# Patient Record
Sex: Male | Born: 1970 | Race: White | Hispanic: No | Marital: Married | Smoking: Former smoker
Health system: Southern US, Community
[De-identification: ages and names within clinical notes are randomized; demographics above are authoritative.]

## PROBLEM LIST (undated history)

## (undated) DIAGNOSIS — K219 Gastro-esophageal reflux disease without esophagitis: Secondary | ICD-10-CM

## (undated) DIAGNOSIS — K449 Diaphragmatic hernia without obstruction or gangrene: Secondary | ICD-10-CM

## (undated) DIAGNOSIS — A0472 Enterocolitis due to Clostridium difficile, not specified as recurrent: Secondary | ICD-10-CM

## (undated) DIAGNOSIS — K635 Polyp of colon: Secondary | ICD-10-CM

## (undated) DIAGNOSIS — K5792 Diverticulitis of intestine, part unspecified, without perforation or abscess without bleeding: Secondary | ICD-10-CM

## (undated) DIAGNOSIS — J302 Other seasonal allergic rhinitis: Secondary | ICD-10-CM

## (undated) HISTORY — DX: Diaphragmatic hernia without obstruction or gangrene: K44.9

## (undated) HISTORY — DX: Polyp of colon: K63.5

## (undated) HISTORY — DX: Other seasonal allergic rhinitis: J30.2

## (undated) HISTORY — DX: Enterocolitis due to Clostridium difficile, not specified as recurrent: A04.72

## (undated) HISTORY — PX: NO PAST SURGERIES: SHX2092

## (undated) HISTORY — DX: Diverticulitis of intestine, part unspecified, without perforation or abscess without bleeding: K57.92

## (undated) HISTORY — DX: Gastro-esophageal reflux disease without esophagitis: K21.9

---

## 2002-02-01 ENCOUNTER — Encounter: Payer: Self-pay | Admitting: Family Medicine

## 2002-02-01 ENCOUNTER — Ambulatory Visit (HOSPITAL_COMMUNITY): Admission: RE | Admit: 2002-02-01 | Discharge: 2002-02-01 | Payer: Self-pay | Admitting: Family Medicine

## 2005-01-21 ENCOUNTER — Ambulatory Visit (HOSPITAL_COMMUNITY): Admission: RE | Admit: 2005-01-21 | Discharge: 2005-01-21 | Payer: Self-pay | Admitting: Family Medicine

## 2005-01-21 ENCOUNTER — Ambulatory Visit: Payer: Self-pay | Admitting: *Deleted

## 2007-11-19 ENCOUNTER — Ambulatory Visit (HOSPITAL_COMMUNITY): Admission: RE | Admit: 2007-11-19 | Discharge: 2007-11-19 | Payer: Self-pay | Admitting: Family Medicine

## 2007-11-22 ENCOUNTER — Ambulatory Visit (HOSPITAL_COMMUNITY): Admission: RE | Admit: 2007-11-22 | Discharge: 2007-11-22 | Payer: Self-pay | Admitting: Family Medicine

## 2007-11-23 ENCOUNTER — Ambulatory Visit: Payer: Self-pay | Admitting: Internal Medicine

## 2007-11-26 ENCOUNTER — Ambulatory Visit (HOSPITAL_COMMUNITY): Admission: RE | Admit: 2007-11-26 | Discharge: 2007-11-26 | Payer: Self-pay | Admitting: Gastroenterology

## 2007-11-26 ENCOUNTER — Encounter: Payer: Self-pay | Admitting: Gastroenterology

## 2007-11-26 ENCOUNTER — Ambulatory Visit: Payer: Self-pay | Admitting: Gastroenterology

## 2007-12-14 ENCOUNTER — Encounter (HOSPITAL_COMMUNITY): Admission: RE | Admit: 2007-12-14 | Discharge: 2008-01-13 | Payer: Self-pay | Admitting: Family Medicine

## 2008-08-22 DIAGNOSIS — A0472 Enterocolitis due to Clostridium difficile, not specified as recurrent: Secondary | ICD-10-CM

## 2008-08-22 HISTORY — DX: Enterocolitis due to Clostridium difficile, not specified as recurrent: A04.72

## 2009-04-10 ENCOUNTER — Ambulatory Visit: Payer: Self-pay | Admitting: Gastroenterology

## 2009-04-10 ENCOUNTER — Observation Stay (HOSPITAL_COMMUNITY): Admission: RE | Admit: 2009-04-10 | Discharge: 2009-04-12 | Payer: Self-pay | Admitting: Family Medicine

## 2009-04-14 ENCOUNTER — Encounter: Payer: Self-pay | Admitting: Gastroenterology

## 2009-05-04 ENCOUNTER — Ambulatory Visit: Payer: Self-pay | Admitting: Gastroenterology

## 2009-05-04 ENCOUNTER — Ambulatory Visit (HOSPITAL_COMMUNITY): Admission: RE | Admit: 2009-05-04 | Discharge: 2009-05-04 | Payer: Self-pay | Admitting: Gastroenterology

## 2009-05-14 ENCOUNTER — Encounter: Payer: Self-pay | Admitting: Gastroenterology

## 2009-10-28 ENCOUNTER — Emergency Department (HOSPITAL_COMMUNITY): Admission: EM | Admit: 2009-10-28 | Discharge: 2009-10-28 | Payer: Self-pay | Admitting: Emergency Medicine

## 2010-09-29 ENCOUNTER — Encounter: Payer: Self-pay | Admitting: Cardiology

## 2010-09-29 ENCOUNTER — Ambulatory Visit (INDEPENDENT_AMBULATORY_CARE_PROVIDER_SITE_OTHER): Payer: BC Managed Care – PPO | Admitting: Cardiology

## 2010-09-29 DIAGNOSIS — Z8249 Family history of ischemic heart disease and other diseases of the circulatory system: Secondary | ICD-10-CM

## 2010-09-29 DIAGNOSIS — R002 Palpitations: Secondary | ICD-10-CM | POA: Insufficient documentation

## 2010-10-06 ENCOUNTER — Telehealth (INDEPENDENT_AMBULATORY_CARE_PROVIDER_SITE_OTHER): Payer: Self-pay | Admitting: *Deleted

## 2010-10-07 NOTE — Assessment & Plan Note (Signed)
Summary: **new pt recurant palpitations   Visit Type:  Initial Consult Primary Provider:  Dr.Stephen Gerda Diss   History of Present Illness: 40 year old male referred for cardiology consultation. He presents with a fairly long-standing history of intermittent palpitations over the last several years. More recently, he has had at least 2 episodes that were more prolonged and intense. He states that he stood up from the couch and felt a "forceful" heartbeat that lasted for approximately 30 seconds, not particularly fast however. Later on that evening he awoke from sleep with a more rapid "racing" heart beat. He has felt both of these types of symptoms over the years, usually only for a few seconds however. No clearly associated dizziness, never any episodes of syncope. Does seem to note symptoms in times of increased stress or with increased caffeine.  At baseline he reports no major functional limitations. 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 He has NYHA class I dyspnea on exertion, no reproducible exertional chest pain. No palpitations precipitated by exertion. States he has a "hiatal hernia" and uses acid reduction medications as needed.  Faxed copy of recent ECGs reviewed showing sinus rhythm, PR interval 132 ms, no obvious preexcitation.  Echocardiogram from 2006 is reviewed below.  Current Medications (verified): 1)  Tylenol 325 Mg Tabs (Acetaminophen) .... Take As Needed For Pain 2)  Prevacid 15 Mg Cpdr (Lansoprazole) .... Take 1 Tab Daily  Allergies (verified): No Known Drug Allergies  Comments:  Nurse/Medical Assistant: no list no meds patient stated meds [facility]  Past History:  Family History: Last updated: Oct 08, 2010 Father: deceased due to bladder cancer, CAD in his 31s Mother: alive and well  Social History: Last updated: 2010/10/08 Full Time Art gallery manager) Married - wife is a Engineer, civil (consulting) Tobacco Use - No.  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  Alcohol Use - no Regular Exercise - no Drug Use - no  Review of Systems  The patient denies anorexia, fever, weight loss, chest pain, syncope, dyspnea on exertion, peripheral edema, prolonged cough, hemoptysis, abdominal pain, melena, and hematochezia.         Otherwise reviewed and negative except as outlined above.  Vital Signs:  Patient profile:   40 year old male Height:      73 inches Weight:      208 pounds BMI:     27.54 Pulse rate:   66 / minute BP sitting:   135 / 82  (left arm)  Vitals Entered By: Dreama Saa, CNA October 08, 2010 8:52 AM)  Physical Exam  Additional Exam:  Well-developed, normally nourished appearing male in no acute distress. HEENT: Conjunctiva and lids normal, oropharynx with moist mucosa. Neck: Supple, no elevated JVP or carotid bruits, no thyromegaly. Lungs: Clear to auscultation, nonlabored. Cardiac: Regular rate and rhythm, no S3, no rub, no significant murmur. No click. Abdomen: Soft, nontender, bowel sounds present. Extremities: No pitting edema, distal pulses full. Skin: Warm and dry. Musculoskeletal: No gross deformities. Neuropsychiatric: Alert and oriented x3, affect appropriate.   Echocardiogram  Procedure date:  01/21/2005  Findings:        Left atrium is 41 mm.  Septum is 10 mm.   Posterior wall is 10 mm.   Left ventricular diastolic dimension is 47 mm.   Left ventricular systolic dimension is 31 mm.   TWO-D AND DOPPLER IMAGING:  The left ventricle is normal size with normal  systolic function. Estimated ejection fraction is 60-65%. There are no wall  motion abnormalities  seen.   The right ventricle is top normal in size and in some views looks to be  frankly dilated. I think the overall systolic function is normal. It is  difficult to assess with this study.   Both atria appear to be mildly enlarged.   The aortic valve is morphologically unremarkable with no stenosis or  regurgitation.   The mitral valve is morphologically unremarkable with no stenosis or  regurgitation.   The tricuspid valve is morphologically unremarkable with no stenosis or  regurgitation.   Pulmonic valve not well seen.   The ascending aorta. Aortic arch and descending aorta to the limits of the  study are normal.   The inferior vena cava appears to be normal size.   There is no pericardial effusion.   EKG  Procedure date:  09/29/2010  Findings:      Sinus rhythm at 70 beats per minute with sinus arrhythmia, otherwise normal intervals.  Impression & Recommendations:  Problem # 1:  PALPITATIONS (ICD-785.1)  Etiology not yet certain. Based on description, could be describing brief episodes of PSVT, perhaps even bursts of ectopy. Other arrhythmias to be considered, however generally would suspect a benign process. He has noted these intermittently for years, however somewhat worse recently. ECG shows no clear evidence of preexcitation. He has never worn a cardiac monitor or had any of these episodes objectively documented as to etiology. We discussed this, and will proceed with an event recorder as this may give [place] the best opportunity to make a diagnosis. Caffeine reduction discussed. Also regular exercise. Office followup scheduled.  Orders: Cardionet/Event Monitor (Cardionet/Event)  Problem # 2:  FAMILY HISTORY OF ISCHEMIC HEART DISEASE (ICD-V17.3)  Specifically in his father. He is not reporting any obvious angina or unusual shortness of breath. Does not report any exercise-induced palpitations.  Patient Instructions: 1)  Your physician recommends that you  schedule a follow-up appointment in: 3 to 4 weeks 2)  Your physician recommends that you continue on your current medications as directed. Please refer to the Current Medication list given to you today. 3)  Your physician has recommended that you wear an event monitor.  Event monitors are medical devices that record the heart's electrical activity. Doctors most often use these monitors to diagnose arrhythmias. Arrhythmias are problems with the speed or rhythm of the heartbeat. The monitor is a small, portable device. You can wear one while you do your normal daily activities. This is usually used to diagnose what is causing palpitations/syncope (passing out).

## 2010-10-07 NOTE — Letter (Signed)
Summary: [facility]  [facility]   Imported By: Faythe Ghee 09/29/2010 14:39:09  _____________________________________________________________________  External Attachment:    Type:   Image     Comment:   External Document

## 2010-10-13 DIAGNOSIS — I4589 Other specified conduction disorders: Secondary | ICD-10-CM

## 2010-10-22 ENCOUNTER — Telehealth (INDEPENDENT_AMBULATORY_CARE_PROVIDER_SITE_OTHER): Payer: Self-pay | Admitting: *Deleted

## 2010-10-28 ENCOUNTER — Ambulatory Visit (INDEPENDENT_AMBULATORY_CARE_PROVIDER_SITE_OTHER): Payer: BC Managed Care – PPO | Admitting: Cardiology

## 2010-10-28 ENCOUNTER — Encounter: Payer: Self-pay | Admitting: Cardiology

## 2010-10-28 DIAGNOSIS — I471 Supraventricular tachycardia: Secondary | ICD-10-CM

## 2010-10-28 NOTE — Progress Notes (Signed)
  Phone Note Other Incoming   Caller: peer to peer  Summary of Call: peer to peer review  for [employer] heart monitor [phone] ext [phone] Initial call taken by: Teressa Lower RN,  October 06, 2010 4:58 PM  Follow-up for Phone Call        Called the number and was told that the department was shut down temporarily.  I left a message to have them call me back.  I will try again tomorrow Follow-up by: Joni Reining NP  Additional Follow-up for Phone Call Additional follow up Details #1::        left message again today with [employer] staff about peer to peer  Additional Follow-up by: Teressa Lower RN,  October 18, 2010 1:56 PM    Additional Follow-up for Phone Call Additional follow up Details #2::    reference # [id number]  Follow-up by: Teressa Lower RN,  October 18, 2010 4:59 PM  Additional Follow-up for Phone Call Additional follow up Details #3:: Details for Additional Follow-up Action Taken: Spoke with Dr. Leonor Liv on 10/19/2010.  He states that [facility].BS does not cover live monitoring of cardiac telemetry as OP. He advises to change to monitor that can be downloaded and read by physician as this is covered. We are to bill accordingly if this type of monitor is chosen Additional Follow-up by: Joni Reining NP   Appended Document:  Reordered [employer] event recorder per [employer], lmom for pt to discuss changes.  Appended Document:  [employer] told pt to wear the monitor they sent out and they will bill for correct monitor

## 2010-10-28 NOTE — Progress Notes (Signed)
  Phone Note Call from Patient   Caller: Patient Call For: Irritation from monitor Summary of Call: spoke with Dr. Diona Browner in clinic 10/21/10, he looked  over pt's lyfewatch tracing, stated pt could return monitor on Monday if needed. He had enougfh tracing to make a dx Initial call taken by: Teressa Lower RN,  October 22, 2010 12:22 PM  Follow-up for Phone Call        lmom for pt to send monitor back on Monday Follow-up by: Teressa Lower RN,  October 22, 2010 12:22 PM

## 2010-11-02 NOTE — Assessment & Plan Note (Signed)
Summary: 3-4 wk f/u per checkout on 09/29/10/tg   Visit Type:  Follow-up Primary Provider:  Dr.Stephen Gerda Diss   History of Present Illness: 40 year old male presents for followup. He was seen back in February with a history of palpitations.  [employer] monitor was placed with findings of sinus rhythm, some PACs, rare PVCs, and a brief burst of PSVT comprised of only 7 beats. Otherwise no pauses or ventricular arrhythmias.  We discussed the monitor results today. He has had only very brief, occasional palpitations, overall reports feeling well.  Current Medications (verified): 1)  Tylenol 325 Mg Tabs (Acetaminophen) .... Take As Needed For Pain 2)  Prevacid 15 Mg Cpdr (Lansoprazole) .... 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  Alcohol Use - no Regular Exercise - no Drug Use - no  Review of Systems  The patient denies anorexia, fever, weight loss, chest pain, syncope, and dyspnea on exertion.         Otherwise reviewed and negative except as outlined.  Vital Signs:  Patient profile:   40 year old male Weight:      210 pounds BMI:     27.81 Pulse rate:   68 / minute BP sitting:   136 / 77  (left arm)  Vitals Entered By: Dreama Saa, CNA (October 28, 2010 8:18 AM)  Physical Exam  Additional Exam:  Well-developed, normally nourished appearing male in no acute distress. HEENT: Conjunctiva and lids normal, oropharynx with moist mucosa. Neck: Supple, no elevated JVP or carotid bruits, no thyromegaly. Lungs: Clear to auscultation, nonlabored. Cardiac: Regular rate and rhythm, no S3, no rub, no significant murmur. No  click. Abdomen: Soft, nontender, bowel sounds present. Extremities: No pitting edema, distal pulses full.   Impression & Recommendations:  Problem # 1:  PSVT (ICD-427.0)  Only a very brief episode noted by monitoring, however most likely the source of his palpitations, along with PACs. Symptomatically he is stable at this point, comfortable with observation. If his symptoms worsen we can certainly see him back to discuss medical therapy options, even the possibility of radiofrequency ablation.  Patient Instructions: 1)  Your physician recommends that you schedule a follow-up appointment in: as needed  2)  Your physician recommends that you continue on your current medications as directed. Please refer to the Current Medication list given to you today.

## 2010-11-02 NOTE — Procedures (Signed)
Summary: [facility]  [employer]   Imported By: Faythe Ghee 10/28/2010 11:44:00  _____________________________________________________________________  External Attachment:    Type:   Image     Comment:   External Document

## 2010-11-15 LAB — CBC
HCT: 43.9 % (ref 39.0–52.0)
MCV: 81.5 fL (ref 78.0–100.0)
Platelets: 197 10*3/uL (ref 150–400)
RDW: 13.1 % (ref 11.5–15.5)

## 2010-11-15 LAB — COMPREHENSIVE METABOLIC PANEL
Albumin: 3.8 g/dL (ref 3.5–5.2)
BUN: 6 mg/dL (ref 6–23)
Calcium: 8.8 mg/dL (ref 8.4–10.5)
Creatinine, Ser: 0.88 mg/dL (ref 0.4–1.5)
GFR calc Af Amer: >60 mL/min (ref >60)
Total Protein: 6.9 g/dL (ref 6.0–8.3)

## 2010-11-15 LAB — DIFFERENTIAL
Basophils Absolute: 0 10*3/uL (ref 0.0–0.1)
Lymphocytes Relative: 22 % (ref 12–46)
Lymphs Abs: 1.4 10*3/uL (ref 0.7–4.0)
Monocytes Absolute: 0.8 10*3/uL (ref 0.1–1.0)
Monocytes Relative: 14 % — ABNORMAL HIGH (ref 3–12)
Neutro Abs: 3.8 10*3/uL (ref 1.7–7.7)

## 2010-11-27 LAB — BASIC METABOLIC PANEL
BUN: 5 mg/dL — ABNORMAL LOW (ref 6–23)
CO2: 26 mEq/L (ref 19–32)
Chloride: 109 mEq/L (ref 96–112)
Creatinine, Ser: 0.89 mg/dL (ref 0.4–1.5)
GFR calc non Af Amer: >60 mL/min (ref >60)
Potassium: 4.4 mEq/L (ref 3.5–5.1)
Sodium: 140 mEq/L (ref 135–145)

## 2010-11-27 LAB — URINE CULTURE: Culture: NO GROWTH

## 2010-11-27 LAB — STOOL CULTURE

## 2010-11-27 LAB — CBC
HCT: 41.5 % (ref 39.0–52.0)
Hemoglobin: 14.5 g/dL (ref 13.0–17.0)
MCHC: 34.8 g/dL (ref 30.0–36.0)
MCV: 83.7 fL (ref 78.0–100.0)
Platelets: 202 10*3/uL (ref 150–400)
RBC: 4.67 MIL/uL (ref 4.22–5.81)
RBC: 4.98 MIL/uL (ref 4.22–5.81)
WBC: 5.1 10*3/uL (ref 4.0–10.5)

## 2010-11-27 LAB — DIFFERENTIAL
Basophils Relative: 1 % (ref 0–1)
Eosinophils Absolute: 0.1 10*3/uL (ref 0.0–0.7)
Eosinophils Relative: 2 % (ref 0–5)
Eosinophils Relative: 2 % (ref 0–5)
Lymphocytes Relative: 26 % (ref 12–46)
Lymphs Abs: 1.3 10*3/uL (ref 0.7–4.0)
Monocytes Absolute: 0.6 10*3/uL (ref 0.1–1.0)
Monocytes Relative: 10 % (ref 3–12)
Neutrophils Relative %: 54 % (ref 43–77)

## 2010-11-27 LAB — URINALYSIS, ROUTINE W REFLEX MICROSCOPIC
Glucose, UA: NEGATIVE mg/dL
Hgb urine dipstick: NEGATIVE
Protein, ur: NEGATIVE mg/dL

## 2010-11-27 LAB — CULTURE, BLOOD (ROUTINE X 2)
Culture: NO GROWTH
Culture: NO GROWTH
Report Status: 8252010

## 2010-11-27 LAB — CLOSTRIDIUM DIFFICILE EIA: C difficile Toxins A+B, EIA: NEGATIVE

## 2011-01-04 NOTE — Discharge Summary (Signed)
NAME:  Tony Gonzales, Tony Gonzales                ACCOUNT NO.:  [id number]   MEDICAL RECORD NO.:  [id number]          PATIENT TYPE:  INP   LOCATION:  A336                          FACILITY:  [facility]   PHYSICIAN:  Melissa L. Ladona Ridgel, MD  DATE OF BIRTH:  Jan 07, 1971   DATE OF ADMISSION:  04/10/2009  DATE OF DISCHARGE:  08/22/2010LH                               DISCHARGE SUMMARY   DISCHARGE DIAGNOSES:  1. Colitis with possible epiploic appendagitis. The patient has had      stool studies which showed no obvious Clostridium difficile      infection or blood.  He is tolerating an oral diet and will be      discharged to home on a course of antibiotic therapy which will      complete 14 days of Cipro and Flagyl.  The patient will have a      follow-up appointment in the next month with Dr. Cira Servant. He can call      the office if he has any worsening of his symptoms for that      appointment.   He had hematuria on initial urinalysis. A repeat urinalysis was obtained  as the patient's father did have a history of bladder cancer. In the  hospital we did not detect any hematuria. I would recommend that his  primary care physician just recheck during his next visit to assure that  there is no recurrence of blood.   1. Diarrhea.  The patient states today that he is having more formed      stool. It is still relatively loose, but it has decreased in      frequency and it seems as if he is moving towards a more normalized      stool.   PAST MEDICAL HISTORY:  Seasonal allergies.  These are stable.   Medications at the time of discharge will be Flagyl 500 mg 1 p.o. q.8 h  x12 days and Cipro 750 mg 1 p.o. q.12 h for 12 days.   The patient has not been requiring any pain medication. He can use  Tylenol over-the-counter   HOSPITAL COURSE:  The patient is a 40 year old white male who presented  to the emergency room with 3 days of sensation of gas lock on his left  side of his abdomen. Evidently he treated himself  with ibuprofen and Gas-  X with no relief. Symptoms became worse to the point where he was not  eating.  The patient's spouse sent him to his primary care physician,  who CT scanned his abdomen as an outpatient which showed thickening of  the mid descending colon and mild thickening of the rectum as well.  There was no obvious source for what was noted to be hematuria in the  outpatient office.  The patient was brought over to the hospital for  further evaluation as he was having persistent anorexia and inability to  eat.  The patient was admitted to the general medical floor, started on  IV fluid hydration and Cipro and Flagyl IV. He was seen by Dr. Cira Servant who  feels  that this represents an underlying colitis as well as an epiploic  appendagitis. Treatment of choice is antibiotic therapy as well as  occasional nonsteroidal and anti-inflammatory medications.  The patient  over the course of the night improved in his pain and was able to  tolerate advancement of his diet without difficulty.  The patient did  continue to have multiple small loose stools on his first day of  admission.  But by the second day he stated it was slowing down and  starting to become more formed.  On the day of discharge the patient  states that again no real significant pain except when there is deep  palpation over the left lower quadrant and the stools are becoming  slightly more formed and less frequent.   PHYSICAL EXAMINATION:  VITAL SIGNS:  Temperature 97.7, blood pressure  109/71, pulse 62, respirations 20, saturation 99%.  GENERAL: This is a well-developed, well-nourished white male in no acute  distress.  HEENT:  He is normocephalic, atraumatic.  Pupils equal, round, and  reactive to light.  Extraocular muscles are intact.  Mucous membranes  are moist.  Neck is supple.  There is no JVD, no lymph nodes, no carotid  bruits.  CHEST:  Clear to auscultation.  There are no wheezes, rales, or rhonchi.   CARDIOVASCULAR: Regular rhythm, positive S1, S2.  No S3 or S4.  I do not  appreciate murmur, rub, or gallop.  ABDOMEN: Soft, mildly obese and tender just ever so slightly in the left  lower quadrant, approximately a 3 fingerbreadth area, without guarding  or rebound.  EXTREMITIES: No clubbing, cyanosis or edema.  NEUROLOGICAL:  Awake, alert, oriented, and feeling much better.  Cranial  nerves II-XII are intact.  Power is 5/5. DTRs 2+.  Plantars are  downgoing.   Pertinent laboratories during the course of the hospital stay reveal a  discharging sodium of 140, potassium 3.6, chloride 109, CO2 of 26, BUN  5, creatinine 0.89, and glucose of 123.  His blood cultures have been  negative.  His C.  Diff toxin was negative, and his fecal occult blood  testing was also negative.  He also had a urinalysis which was  completely negative for any blood.   At this time the patient is deemed stable for follow-up as an  outpatient.  He will complete a total of 14 days of antibiotic therapy.  I have given him a note to release him to work approximately next  Wednesday. If he continues to have diarrhea I would recommend that he go  to his primary care physician's office to obtain further release from  work until he is more stable and reevaluation of his symptoms.   Condition at discharge is stable.  Disposition is to home.  Diet is a  regular diet.      Melissa L. Ladona Ridgel, MD  Electronically Signed     MLT/MEDQ  D:  04/12/2009  T:  04/12/2009  Job:  [id number]   cc:   Melony Overly, PA   Kassie Mends, M.D.  [address]  [place] , [place] [address]

## 2011-01-04 NOTE — Consult Note (Signed)
NAME:  Tony Gonzales, Tony Gonzales                ACCOUNT NO.:  [id number]   MEDICAL RECORD NO.:  [id number]          PATIENT TYPE:  AMB   LOCATION:  DAY                           FACILITY:  [facility]   PHYSICIAN:  Kassie Mends, M.D.      DATE OF BIRTH:  [birth year]   DATE OF CONSULTATION:  DATE OF DISCHARGE:                                 CONSULTATION   REASON FOR CONSULTATION:  Weight loss, gastrohepatic ligament lymph  node, abnormal barium swallow.   HISTORY OF PRESENT ILLNESS:  The patient is a 40 year old Caucasian  gentleman who presents today for further evaluation of weight loss,  abnormal barium study.  He states his symptoms began back in January  2009.  He had a lot of insomnia.  He noted that he was having some  diarrhea.  He was having 2-3 loose stools a day.  He has had some  discomfort in the upper abdomen.  He describes it as gas trapping.  If  he is able to belch, it seems to help.  He is having some nocturnal  diarrhea.  He denies any blood in the stools or melena.  He admits to  about a 20 to 25 pounds weight loss over the last 3 months.  He has had  some mild heartburn related to certain foods for which he takes Tums.  He denies any dysphagia, odynophagia.  He has had some pain in between  his shoulder blades but more so on the right side.  He denies any fever  or chills, ill contacts, no recent antibiotic use.  His wife is a Engineer, civil (consulting)  in the ER.  She is present today.   Workup thus far has included a normal CBC, MET-7, LFTs, TSH, and a  negative H. pylori serology.  He initially had a CT of the chest,  abdomen, and pelvis.  On these studies he had a borderline gastrohepatic  ligament lymph node.  The gallbladder was unremarkable.  The stomach was  not well distended.  It was felt that the gastrohepatic ligament lymph  node may be related to an esophageal process; therefore, a barium  swallow was recommended and performed.  On this study he had minimal  gastroesophageal reflux, short  segment of minimal esophageal narrowing,  and mucosal irregularity at the distal esophagus, question esophagitis  versus tumor.  EGD was recommended.   CURRENT MEDICATIONS:  1. Celexa 20 mg daily.  2. Ambien-CR 12.5 mg p.r.n.   ALLERGIES:  No known drug allergies.   PAST MEDICAL HISTORY:  1. Seasonal allergies.  2. Possible anxiety/depression.  3. 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   FAMILY HISTORY:  His father is 59 years old and was diagnosed with  bladder cancer in August 2008.  He has metastasis to one of his hips.  He also has heart disease.  No family history of colorectal cancer.   SOCIAL HISTORY:  He is married.  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  Nonsmoker.  No alcohol use.   REVIEW  OF SYSTEMS:  See HPI for GI.  CONSTITUTIONAL:  See HPI.  CARDIOPULMONARY:  No chest pain or shortness of breath, palpations,  cough.  GENITOURINARY:  No dysuria, hematuria.   PHYSICAL EXAMINATION:  VITAL SIGNS:  Weight 188, height 6 feet 1 inch,  temp 98.4, blood pressure 110/78, pulse 76.  GENERAL:  A pleasant, well nourished, well developed Caucasian male in  no acute distress.  SKIN:  Warm and dry.  No jaundice.  HEENT:  Sclerae are nonicteric.  Oropharyngeal mucosa is moist and pink.  No lesions, erythema or exudate.  No lymphadenopathy, thyromegaly.  CHEST:  Lungs are clear to auscultation.  CARDIAC:  Reveals a regular rate and rhythm.  Normal S1, S2.  No  murmurs, rubs, or gallops.  ABDOMEN:  Positive bowel sounds.  Abdomen is  soft.  He has mild tenderness noted just right at the epigastrium on  deep palpation.  No rebound or guarding.  No abdominal bruits or  hernias.  Negative Murphy sign.  No hepatosplenomegaly or masses.  LOWER EXTREMITIES:  No edema.   IMPRESSION:  The patient is a 40 year old gentleman with a 3-month  history of a 25 pounds weight loss, diarrhea with nocturnal diarrhea,   gastroesophageal reflux disease, loss of appetite and early satiety who  recent had a chest, abdominal, pelvic computed tomography which was  suggestive of a borderline enlarged gastrohepatic ligament lymph node.  Barium swallow suggested narrowing and mucosal irregularity at the  distal esophagus, esophagitis versus tumor.  Some of his upper  gastrointestinal symptoms may be explained by findings on his  esophagram; however,  I find it difficult to explain his diarrhea  related to this at this point.  It really is concerning that he has had  significant weight loss, is having nocturnal diarrhea.  He really does  not complain of post prandial symptoms.  I feel very uncomfortable at  this point contributing his weight loss and diarrhea to anxiety-related  to his father's illness.  We need to exclude inflammatory bowel disease,  peptic ulcer disease, malignancy.  I doubt this is biliary but will keep  that in mind as well.   PLAN:  1. Colonoscopy and EGD with Dr. Cira Servant in the near future.  2. At the patient's request, we have not changed any of his medical      management.   I would like to thank Dr. Patrica Duel for allowing [place] to take part in  the care of this patient.      Tana Coast, P.A.      Kassie Mends, M.D.  Electronically Signed    LL/MEDQ  D:  11/23/2007  T:  11/23/2007  Job:  [id number]   cc:   Patrica Duel, M.D.  Fax: [phone]

## 2011-01-04 NOTE — Op Note (Signed)
NAME:  SCOTLAND, KORVER                ACCOUNT NO.:  [id number]   MEDICAL RECORD NO.:  [id number]          PATIENT TYPE:  AMB   LOCATION:  DAY                           FACILITY:  [facility]   PHYSICIAN:  Kassie Mends, M.D.      DATE OF BIRTH:  1971/06/26   DATE OF PROCEDURE:  DATE OF DISCHARGE:                               OPERATIVE REPORT   REFERRING PHYSICIAN:  Patrica Duel, M.D.   PROCEDURE:  1. Ileaocolonoscopy with cold forceps polypectomy and random cold      forceps biopsy.  2. Esophagogastroduodenoscopy with cold forceps biopsies of the      duodenum.   INDICATION FOR EXAM:  Mr. Palazzi is a 40 year old male who has no family  history of colorectal cancer.  He had an intentional weight loss over  the last 3-6 months but it was more than he expected.  He had a 20-25  pound weight loss.  He complains of mild heartburn and belching.  He  sometimes has pain that radiates between his shoulder blades.  He has  diarrhea once a day for at least 4-5 days out of the week.  A year ago  he was having one normal stool daily.  He has multiple psychosocial  stressors including the illness of his dad and spiritual issues.  He  also has upper abdominal pain.   FINDINGS:  1. Normal terminal ileum.  Approximately 10 cm visualized.  2. A 4 mm sessile proximal transverse colon polyp removed via cold      forceps.  Random biopsies obtained via cold forceps to evaluate for      microscopic colitis.  Otherwise no masses, inflammatory changes,      diverticula or AVMs seen.  3. Normal retroflexed view of the rectum.  4. Multiple linear erosions seen in the distal esophagus with      occasional overlying exudate.  5. A 2-3 cm hiatal hernia.  Otherwise normal stomach.  6. Patchy erythema with mild scalloping of the duodenal folds.      Biopsies obtained via cold forceps to evaluate for celiac sprue.   DIAGNOSES:  1. No source for Mr. Goodchild diarrhea identified.  2. Reflux esophagitis which accounts  for the findings on the barium      swallow.   RECOMMENDATIONS:  1. Next endoscopy with propofol.  2. We will call Mr. Cull with the results of his biopsies.  If this      polyp is adenomatous, but has no evidence of high-grade dysplasia      or villous features, then he may have screening colonoscopy in 10      years, according to the 2008 ACG guidelines.  If his duodenal      biopsy shows no evidence of celiac sprue and his colon biopsies      show no evidence of microscopic colitis, then would consider breath      test analysis for lactose intolerance or fructose intolerance.  3. He was given a handout on reflux and hiatal hernia.  4. If his biopsies are negative for celiac sprue, then he  should      follow high-fiber diet.  He was given a handout on high-fiber diet      and polyps.  5. Will begin Nexium 40 mg 30 minutes before his first meal.  6. Follow-up appointment with Dr. Cira Servant in 6 weeks.  7. Patient instructed not to start high-fiber diet until his biopsies      are final.   MEDICATIONS:  Demerol 150 mg IV.  Versed 10 mg IV.   PROCEDURE TECHNIQUE:  Physical exam was performed.  Informed consent was  obtained from the patient after explaining the benefits, risks and  alternatives to the procedure.  The patient was connected to the monitor  and placed in the left lateral position.  After administration of  sedation and rectal exam, the patient's rectum was intubated and the  scope was advanced under direct visualization to the distal terminal  ileum.  The scope was removed slowly by carefully examining the color,  texture, anatomy and integrity of the mucosa on the way out.   After the colonoscopy the patient's esophagus was intubated with a  diagnostic gastroscope and the scope was advanced under direct  visualization to the second portion of the duodenum.  The scope was  removed slowly by carefully examining the color, texture, anatomy and  integrity of the mucosa on  the way out.  The patient was recovered in  endoscopy and discharged home in satisfactory condition.   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      Kassie Mends, M.D.  Electronically Signed     SM/MEDQ  D:  11/26/2007  T:  11/26/2007  Job:  [id number]   cc:   Patrica Duel, M.D.  Fax: [phone]

## 2011-01-04 NOTE — Group Therapy Note (Signed)
NAME:  Tony Gonzales, Tony Gonzales                ACCOUNT NO.:  [id number]   MEDICAL RECORD NO.:  [id number]          PATIENT TYPE:  INP   LOCATION:  A336                          FACILITY:  [facility]   PHYSICIAN:  Melissa L. Ladona Ridgel, MD  DATE OF BIRTH:  04-12-1971   DATE OF PROCEDURE:  04/11/2009  DATE OF DISCHARGE:                                 PROGRESS NOTE   SUBJECTIVE:  The patient was seen and examined in his room, lying in  bed, in no acute distress.  He states that he had a better night.  He is  still having copious amounts of diarrhea, but the pain in his left lower  quadrant has improved significantly.  He is definitively hungry.  He  denies nausea, vomiting, fever or chills.   PHYSICAL EXAMINATION:  VITAL SIGNS:  T-max was 98, temperature 97, blood  pressure 114/69, pulse 55, respirations 20, saturation 100%.  GENERAL:  This a well-developed, well-nourished white male, in no acute  distress.  HEENT:  He is normocephalic, atraumatic.  Pupils equal, round and  reactive to light.  Extraocular muscles intact.  He has anicteric  sclerae.  External ears without lesions.  External nose without lesions  or discharge.  Examination of mouth reveals no oral lesions or lip  lesions.  NECK:  Supple.  There is no JVD.  No lymph nodes.  No carotid bruits.  CHEST:  Chest is clear to auscultation.  There are no rhonchi, rales or  wheezes.  CARDIOVASCULAR:  A regular rate and rhythm.  Positive S1 and S2.  No S3,  S4, no murmurs, rubs or gallops.  ABDOMEN:  Soft and mildly obese, and minimally tender in the area in the  left lower quadrant that was explicitly tender yesterday, so there is  significant improvement.  EXTREMITIES:  Show no clubbing, cyanosis or edema.  NEUROLOGIC:  He is awake, alert and oriented.  Cranial nerves II-XII are  intact.  Power is 5/5.  DTRs 2+.  Plantars are downgoing and gait is  intact.   LABORATORY DATA:  Pertinent laboratories white count is 5.1 with  hemoglobin 14.5,  hematocrit 41.5 and platelets of 200.  Sodium is 140,  potassium 4.4, chloride 107, CO2 of 29, BUN is 9, creatinine 0.86,  glucose 127.  The  C. diff toxin is negative.  Hemoccult was negative.  Blood cultures have remained negative.   ASSESSMENT/PLAN:  The patient is a 40 year old white male who presented  to the emergency room with three days of anorexia, diarrhea and  abdominal pain in the left lower quadrant.  The patient was admitted and  placed on antibiotic therapy, for what appeared to be a colitis on his  CT scan.  He was seen and evaluated by Dr. Kassie Mends, who feels that  this may represent not only a colitis but an epiploica appendage  inflammation.  1. Abdominal pain, diarrhea, colitis:  Improving.  The plan is to      continue IV therapy with antibiotics and IV fluid rehydration, and      advance his diet, to assure that he  can manage at home.  If he is      able to tolerate an oral diet without much deterioration in his      pain or much increase in diarrhea, then we will discharge him to      home on oral antibiotics, with a follow-up appointment with Dr.      Cira Servant.  2. Hematuria:  As stated the urinalysis from admission was negative.      He can follow up as an outpatient with his primary care physician      for repeat studies, just to screen and make sure we are not missing      hematuria.  3. Hypoglycemia:  This is resolved.  I will continue to monitor him      overnight and discharge in the a.m., if he is doing well.   The total time on this case is 30 minutes.      Melissa L. Ladona Ridgel, MD  Electronically Signed     MLT/MEDQ  D:  04/12/2009  T:  04/12/2009  Job:  [id number]

## 2011-01-04 NOTE — Consult Note (Signed)
NAME:  Tony Gonzales, Tony Gonzales                ACCOUNT NO.:  [id number]   MEDICAL RECORD NO.:  [id number]          PATIENT TYPE:  INP   LOCATION:  A336                          FACILITY:  [facility]   PHYSICIAN:  Kassie Mends, M.D.      DATE OF BIRTH:  15-Jun-1971   DATE OF CONSULTATION:  04/10/2009  DATE OF DISCHARGE:                                 CONSULTATION   REASON FOR CONSULTATION:  Abdominal pain and colitis.   HISTORY OF PRESENT ILLNESS:  Tony Gonzales is a 40 year old male who  initially was seen by me in 2009 for weight loss, an enlarged lymph node  and an abnormal barium swallow.  He had a colonoscopy and an upper  endoscopy which revealed benign colonic and duodenal mucosa.  He also  had a simple adenoma.  He was in his usual state of health until  Wednesday when he felt like gas was locked on the left side of his  abdomen.  It did not pass and these symptoms became worse and so his  wife asked him to go to the emergency room today.  He had three to four  episodes of diarrhea.  No bloody stools.  He was using ibuprofen to  treat his symptoms as well as Gas-X.  He denies any strenuous exercise  or trauma to the left side of his abdomen.   PAST MEDICAL HISTORY:  1. Allergies.  2. Possible anxiety and depression.   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   ALLERGIES:  No known drug allergies.   MEDICATIONS:  Cipro and Flagyl.   FAMILY HISTORY:  He has no family history of vasculitis, colon cancer or  colon polyps.   SOCIAL HISTORY:  He is a Curator and works on school buses.  He is  married and has two kids.  He denies any tobacco or alcohol use.  His  wife is a Engineer, civil (consulting) in the emergency department.   REVIEW OF SYSTEMS:  As per the HPI, otherwise all systems are negative.   PHYSICAL EXAMINATION:  Afebrile, hemodynamically stable.  HEENT:  Atraumatic, normocephalic.  Pupils equal and react to light.  Mouth:  No  oral lesions.  Posterior pharynx without erythema or  exudate.  NECK:  Full range of motion and no lymphadenopathy.  LUNGS:  Clear to  auscultation bilaterally.  CARDIOVASCULAR:  Regular rhythm.  No murmur.  Normal S1 and S2.  ABDOMEN:  Bowel sounds are present, soft, mild  tenderness to palpation in the left lower quadrant.  Mild rebound when  his abdomen is palpated on the right side.  He has no guarding.  EXTREMITIES:  No cyanosis or edema.  NEURO:  No focal neurologic  deficits.   RADIOGRAPHIC STUDIES:  CT scan of the abdomen and pelvis showed some  bowel wall thickening in the descending colon and proximal 2 inches.  I  personally reviewed the CT scan with Dr. Alver Fisher.   ASSESSMENT:  Tony Gonzales is a 40 year old male who had the sudden onset  of abdominal pain and diarrhea with changes consistent with colitis.  He  had no evidence of diverticulitis.  The differential diagnosis includes  infectious colitis and epiploic appendicitis.  Thank you for allowing me  to see Tony Gonzales in consultation.  My recommendations follow.   RECOMMENDATIONS:  1. Cipro and Flagyl for 10 days.  2. The patient will return in one month for a flexible sigmoidoscopy  3. Pain control and antiemetics as needed.      Kassie Mends, M.D.  Electronically Signed     SM/MEDQ  D:  04/11/2009  T:  04/11/2009  Job:  [id number]   cc:   Patrica Duel, M.D.  Fax: [phone]

## 2011-01-04 NOTE — H&P (Signed)
NAME:  Tony Gonzales, Tony Gonzales                ACCOUNT NO.:  [id number]   MEDICAL RECORD NO.:  [id number]          PATIENT TYPE:  INP   LOCATION:  A336                          FACILITY:  [facility]   PHYSICIAN:  Melissa L. Ladona Ridgel, MD  DATE OF BIRTH:  12-18-1970   DATE OF ADMISSION:  04/10/2009  DATE OF DISCHARGE:  LH                              HISTORY & PHYSICAL   CHIEF COMPLAINT:  Abdominal pain, anorexia times 3 days.   PRIMARY CARE PHYSICIAN:  Is a Surveyor, mining, Melony Overly, P.A.  at [employer].   HISTORY OF PRESENT ILLNESS:  The patient is a 40 year old white male,  with a known past medical history for hiatal hernia and gastroesophageal  reflux disease, who presents with three days sensation of gas-like  distention in the left upper quadrant of his abdomen.  The patient  states he has just felt like he had a pile of gas with pain and  distention that he could not relieve.  He noted a decreased appetite.  His last meal was the day prior to admission.  The patient had diarrhea,  multiple episodes, that he did not notice any blood in.  He denied  nausea or vomiting; however, he did not eat.  The patient never has had  symptoms like this before.  He does know that he has a hiatal hernia and  esophagitis and was scoped slightly more than a year ago.   REVIEW OF SYSTEMS:  CONSTITUTIONAL;  His weight has been the same up  until this time.  He has had no fever or chills.  HEENT:  Eyes: No  blurred vision, double vision.  Ears/Nose/Throat: No tinnitus, dysphagia  or  discharge.  CARDIOVASCULAR:  No chest pain or palpitations.  RESPIRATORY:  No shortness of breath or cough.  GI:  Anorexia, pain in  the left lower quadrant, diarrhea.  GU: No hesitancy, frequency or  dysuria.  NEUROLOGICALLY:  No headaches or seizure disorder.  MUSCULOSKELETAL:  No joint pain and deformity.  HEMATOLOGY:  No bleeding  disorder.  PSYCH:  No depression or anxiety.   ALLERGIES:  No known drug allergies.   CURRENT  MEDICATIONS:  None.   PAST MEDICAL HISTORY:  Hiatal hernia and her seasonal allergies.   PAST SURGICAL HISTORY:  None, but he has had an EGD in the past as well  as colonoscopy with Dr. Loreta Ave.   SOCIAL HISTORY:  He does not smoke.  He does not drink.  He works as a  Games developer.  He has two daughters and he is married to one of her  nurses in the emergency room.   FAMILY HISTORY:  Dad is deceased with a history of bladder cancer.  Mom  is living, with no known medical illnesses.   PHYSICAL EXAMINATION:  VITAL SIGNS:  In the office temperature is 98,  blood pressure 116/70, pulse 80, respirations 12, saturation 98%.  GENERAL:  He is no acute distress, although he is being very stoic.  HEENT:  Normocephalic, atraumatic.  Pupils equal, round and reactive to  light.  Extraocular muscles intact.  He has  anicteric sclerae.  Examination of the ears reveal tympanic membranes are intact with no  discharge.  Examination of the nose reveals the septum midline, without  discharge or lesions.  Examination of the mouth reveals no oral lesions  or lip lesions.  Dentition is very much intact.  NECK:  Is supple.  There is no JVD, no lymph nodes, no carotid bruits.  CHEST:  Chest is clear to auscultation.  There are no rhonchi, rales or  wheezes.  CARDIOVASCULAR:  Clear.  There are no rhonchi or wheezes.  CARDIOVASCULAR:  Regular rhythm.  Positive S1 and S2, no S3 or S4 or  murmurs, rubs or gallops, no heave or thrill.  No displacement of the  PMI.  ABDOMEN:  Soft with positive exquisite tenderness in the left lower  quadrant and in a circumscribed area.  There appears to be some  voluntary guarding, but no involuntary guarding or rebound.  The patient  is being quite stoic about  his pain.  The area is specifically just  above the left iliac crest.  As stated, no guarding or rebound.  He does  have positive bowel sounds.  No hepatosplenomegaly is noted.  No bruits  noted.  EXTREMITIES:  Show  no clubbing, cyanosis or edema.  He has 2+ pulses.  NEUROLOGIC:  He is awake, alert and oriented x3.  Cranial nerves II-XII  are intact.  Power is 5/5.  DTRs 2+.  Plantars are downgoing.  Gait is  intact.  PSYCH:  Affect is appropriate.  Recent and remote memory are intact.  Judgment and insight are intact.   LABORATORY DATA:  Laboratories from the outpatient office:  White count  is 7.3, hemoglobin of 15.4, hematocrit 45 and platelets of 220.  Sodium  is 138, potassium 4.7, chloride 102, CO2 of 26, BUN 11, creatinine 0.84,  glucose was 78.  LFTs within normal limits.  Calcium was 9.5.   ASSESSMENT/PLAN:  This is a 40 year old white male with three days of  left lower quadrant pain, anorexia and diarrhea.  The CT scan was  obtained in the outpatient office, consistent with colitis, possible  diverticulitis.  1. Colitis:  Will start him on Cipro and Flagyl and hydrate him.      Obtain stool cultures for Clostridium difficile and ferritin and      heme check him.  2. Hematuria:  The patient will need a repeat urinalysis, which can be      obtained as an outpatient, as there was a trace amount of blood      reported in his urinalysis.  A repeat  UA was completed here and it      was completely negative for any blood.  On a subsequent followup, a      UA should be obtained if there is a concern for hematuria, so far      as his father has had bladder cancer.  3. Hypoglycemia:  D-5 normal saline at 100 mL will be given.  Consult      GI for recommendations an outpatient follow-up, as the patient just      recently had a colonoscopy and would not be in a position to need a      study at this time.  4. Deep venous thrombosis prophylaxis:  The patient will ambulate.  If      he is going to have protracted debility, then we could start      Lovenox, but at this time I do not  desire to do so.   Total time on this case was approximately 50 minutes.      Melissa L. Ladona Ridgel, MD   Electronically Signed     MLT/MEDQ  D:  04/12/2009  T:  04/12/2009  Job:  [id number]   cc:   Melony Overly, PA

## 2011-01-07 NOTE — Procedures (Signed)
Tony Gonzales, Tony Gonzales NO.:  [id number]   MEDICAL RECORD NO.:  [id number]          PATIENT TYPE:  OUT   LOCATION:  RAD                           FACILITY:  [facility]   PHYSICIAN:  Vida Roller, M.D.   DATE OF BIRTH:  1971-01-02   DATE OF PROCEDURE:  01/21/2005  DATE OF DISCHARGE:                                  ECHOCARDIOGRAM   PRIMARY:  Dr. Nobie Putnam.   Tape number [id number], tape count 2015 through 2528.   This is a 39 year old man with palpitations. Technical quality study is  adequate.   M-MODE TRACINGS:  The aorta is 30 mm.   Left atrium is 41 mm.   Septum is 10 mm.   Posterior wall is 10 mm.   Left ventricular diastolic dimension is 47 mm.   Left ventricular systolic dimension is 31 mm.   TWO-D AND DOPPLER IMAGING:  The left ventricle is normal size with normal  systolic function. Estimated ejection fraction is 60-65%. There are no wall  motion abnormalities seen.   The right ventricle is top normal in size and in some views looks to be  frankly dilated. I think the overall systolic function is normal. It is  difficult to assess with this study.   Both atria appear to be mildly enlarged.   The aortic valve is morphologically unremarkable with no stenosis or  regurgitation.   The mitral valve is morphologically unremarkable with no stenosis or  regurgitation.   The tricuspid valve is morphologically unremarkable with no stenosis or  regurgitation.   Pulmonic valve not well seen.   The ascending aorta. Aortic arch and descending aorta to the limits of the  study are normal.   The inferior vena cava appears to be normal size.   There is no pericardial effusion.      JH/MEDQ  D:  01/21/2005  T:  01/22/2005  Job:  [id number]

## 2011-02-03 ENCOUNTER — Ambulatory Visit (INDEPENDENT_AMBULATORY_CARE_PROVIDER_SITE_OTHER): Payer: BC Managed Care – PPO | Admitting: Otolaryngology

## 2011-02-03 DIAGNOSIS — R070 Pain in throat: Secondary | ICD-10-CM

## 2011-02-03 DIAGNOSIS — J342 Deviated nasal septum: Secondary | ICD-10-CM

## 2011-02-03 DIAGNOSIS — J310 Chronic rhinitis: Secondary | ICD-10-CM

## 2011-08-22 ENCOUNTER — Encounter: Payer: Self-pay | Admitting: Cardiology

## 2013-05-20 ENCOUNTER — Encounter: Payer: Self-pay | Admitting: Family Medicine

## 2013-05-20 ENCOUNTER — Ambulatory Visit (INDEPENDENT_AMBULATORY_CARE_PROVIDER_SITE_OTHER): Payer: BC Managed Care – PPO | Admitting: Family Medicine

## 2013-05-20 VITALS — BP 102/76 | Temp 98.2°F | Ht 73.0 in | Wt 206.0 lb

## 2013-05-20 DIAGNOSIS — J302 Other seasonal allergic rhinitis: Secondary | ICD-10-CM

## 2013-05-20 DIAGNOSIS — J309 Allergic rhinitis, unspecified: Secondary | ICD-10-CM

## 2013-05-20 MED ORDER — METHYLPREDNISOLONE ACETATE 40 MG/ML IJ SUSP
40.0000 mg | Freq: Once | INTRAMUSCULAR | Status: AC
  Administered 2013-05-20: 40 mg via INTRAMUSCULAR

## 2013-05-20 MED ORDER — FLUTICASONE PROPIONATE 50 MCG/ACT NA SUSP: 2.0000 | Freq: Every day | NASAL | Status: DC

## 2013-05-20 NOTE — Progress Notes (Signed)
  Subjective:    Patient ID: Tony Gonzales, male    DOB: [birth year], 42 y.o.   MRN: [id number]  Sinusitis This is a new problem. The current episode started in the past 7 days. The problem is unchanged. His pain is at a severity of 6/10. The pain is moderate. Associated symptoms include congestion, headaches, sinus pressure and sneezing. Treatments tried: allegra. The treatment provided no relief.   Patient has history of allergies.   Review of Systems  HENT: Positive for congestion, sneezing and sinus pressure.   Neurological: Positive for headaches.       Objective:   Physical Exam Nostrils clear eardrums normal sinus nontender throat normal neck supple lungs clear no wheezing       Assessment & Plan:  Significant allergic rhinitis-add Flonase continue Allegra also Depo-Medrol shot. If sinus infection symptoms over the next 5-7 days notify us. No need for antibiotic currently.

## 2013-06-04 ENCOUNTER — Telehealth: Payer: Self-pay | Admitting: Family Medicine

## 2013-06-04 ENCOUNTER — Other Ambulatory Visit: Payer: Self-pay | Admitting: *Deleted

## 2013-06-04 MED ORDER — AZITHROMYCIN 250 MG PO TABS: ORAL_TABLET | ORAL | Status: DC

## 2013-06-04 NOTE — Telephone Encounter (Signed)
Patient was seen on 9/29 with severe allergies not now he has green running nose and cough. He wants antibotic called into [facility].

## 2013-06-04 NOTE — Telephone Encounter (Signed)
Left message notifing pt med was sent to [facility]

## 2013-06-04 NOTE — Telephone Encounter (Signed)
zpk

## 2013-10-07 ENCOUNTER — Ambulatory Visit (INDEPENDENT_AMBULATORY_CARE_PROVIDER_SITE_OTHER): Payer: BC Managed Care – PPO | Admitting: Nurse Practitioner

## 2013-10-07 ENCOUNTER — Encounter: Payer: Self-pay | Admitting: Nurse Practitioner

## 2013-10-07 ENCOUNTER — Telehealth: Payer: Self-pay | Admitting: Family Medicine

## 2013-10-07 VITALS — BP 134/86 | Temp 97.9°F | Ht 73.0 in | Wt 199.0 lb

## 2013-10-07 DIAGNOSIS — K5730 Diverticulosis of large intestine without perforation or abscess without bleeding: Secondary | ICD-10-CM

## 2013-10-07 DIAGNOSIS — K5790 Diverticulosis of intestine, part unspecified, without perforation or abscess without bleeding: Secondary | ICD-10-CM

## 2013-10-07 DIAGNOSIS — R1032 Left lower quadrant pain: Secondary | ICD-10-CM

## 2013-10-07 MED ORDER — CIPROFLOXACIN HCL 500 MG PO TABS: 500.0000 mg | ORAL_TABLET | Freq: Two times a day (BID) | ORAL | Status: DC

## 2013-10-07 MED ORDER — METRONIDAZOLE 500 MG PO TABS: 500.0000 mg | ORAL_TABLET | Freq: Three times a day (TID) | ORAL | Status: DC

## 2013-10-07 NOTE — Telephone Encounter (Addendum)
Office visit scheduled today 

## 2013-10-07 NOTE — Telephone Encounter (Signed)
Patient has a history of diverticulitis and is having symptoms.  No pain or blood, but he is having gassy feeling. He would like to know  If he needs to come in or if we can call him in something.   Robbie LisBelmont

## 2013-10-13 ENCOUNTER — Encounter: Payer: Self-pay | Admitting: Nurse Practitioner

## 2013-10-13 DIAGNOSIS — K5790 Diverticulosis of intestine, part unspecified, without perforation or abscess without bleeding: Secondary | ICD-10-CM | POA: Insufficient documentation

## 2013-10-13 NOTE — Progress Notes (Signed)
Subjective:  Presents for complaints of left lower quadrant pain that began 4 days ago. Has a history of diverticulitis. Has tried a liquid diet, symptoms remain unchanged. No fever. No diarrhea or constipation. No blood in his stool. No nausea vomiting. Increased flatulence. No urinary symptoms. Unassociated with meals or activity. Unassociated with any particular foods. Cannot recall anything in his diet that may have flared up this diverticulitis.  Objective:   BP 134/86  Temp(Src) 97.9 F (36.6 C)  Ht 6\' 1"  (1.854 m)  Wt 199 lb (90.266 kg)  BMI 26.26 kg/m2 NAD. Alert, oriented. Lungs clear. Heart regular rhythm. Abdomen soft nondistended with active bowel sounds x4; moderate left lower quadrant abdominal tenderness, no rebound or guarding. No obvious masses.  Assessment:  Problem List Items Addressed This Visit     Digestive   Diverticulosis    Other Visit Diagnoses   Abdominal pain, left lower quadrant    -  Primary       Plan: Meds ordered this encounter  Medications  . ciprofloxacin (CIPRO) 500 MG tablet    Sig: Take 1 tablet (500 mg total) by mouth 2 (two) times daily.    Dispense:  20 tablet    Refill:  0    Order Specific Question:  Supervising Provider    Answer:  Merlyn AlbertLUKING, WILLIAM S [[id number]]  . metroNIDAZOLE (FLAGYL) 500 MG tablet    Sig: Take 1 tablet (500 mg total) by mouth 3 (three) times daily.    Dispense:  30 tablet    Refill:  0    Order Specific Question:  Supervising Provider    Answer:  Merlyn AlbertLUKING, WILLIAM S [[id number]]   it is unclear at this point whether this is true diverticulitis. Patient wishes to continue watching his diet to see if symptoms will resolve. Given prescriptions for Cipro and Flagyl in case they are needed. Reviewed warning signs. Patient to call back if symptoms worsen or persist.

## 2014-03-19 ENCOUNTER — Encounter: Payer: Self-pay | Admitting: Family Medicine

## 2014-03-19 ENCOUNTER — Ambulatory Visit (INDEPENDENT_AMBULATORY_CARE_PROVIDER_SITE_OTHER): Payer: BC Managed Care – PPO | Admitting: Family Medicine

## 2014-03-19 VITALS — BP 124/90 | Ht 73.0 in | Wt 207.0 lb

## 2014-03-19 DIAGNOSIS — M545 Low back pain, unspecified: Secondary | ICD-10-CM

## 2014-03-19 MED ORDER — CYCLOBENZAPRINE HCL 10 MG PO TABS
10.0000 mg | ORAL_TABLET | Freq: Three times a day (TID) | ORAL | Status: DC | PRN
Start: 2014-03-19 — End: 2015-05-12

## 2014-03-19 MED ORDER — HYDROCODONE-ACETAMINOPHEN 5-325 MG PO TABS: ORAL_TABLET | ORAL | Status: DC

## 2014-03-19 NOTE — Progress Notes (Signed)
   Subjective:    Patient ID: Tony Gonzales, male    DOB: 04/01/1971, 43 y.o.   MRN: [id number]  Back Pain This is a new problem. The current episode started in the past 7 days. The pain is present in the lumbar spine. The quality of the pain is described as burning. The pain radiates to the right knee (left hip). The pain is worse during the day. The symptoms are aggravated by sitting and bending. Stiffness is present all day. He has tried muscle relaxant for the symptoms.   Patient tries to avoid anti-inflammatory medicine. This pain comes and goes often goes many months without pain. Sometimes very trivial strain contributed.  No fever no chills   Review of Systems  Musculoskeletal: Positive for back pain.   No vomiting no diarrhea no change in urinary habits    Objective:   Physical Exam  Alert moderate malaise lungs clear. Heart regular in rhythm. Substantial. Lumbar tenderness to deep palpation. Negative straight leg raise strength sensation intact.      Assessment & Plan:  Impression lumbar strain with spasm plan encouraged to use Aleve or similar add Flexeril. Add hydrocodone. Local measures discussed. WSL

## 2015-05-12 ENCOUNTER — Ambulatory Visit (INDEPENDENT_AMBULATORY_CARE_PROVIDER_SITE_OTHER): Payer: Worker's Compensation

## 2015-05-12 ENCOUNTER — Ambulatory Visit (INDEPENDENT_AMBULATORY_CARE_PROVIDER_SITE_OTHER): Payer: Worker's Compensation | Admitting: Nurse Practitioner

## 2015-05-12 ENCOUNTER — Encounter: Payer: Self-pay | Admitting: Nurse Practitioner

## 2015-05-12 VITALS — BP 152/87 | HR 61 | Temp 97.9°F | Ht 73.0 in | Wt 215.0 lb

## 2015-05-12 DIAGNOSIS — S61311A Laceration without foreign body of left index finger with damage to nail, initial encounter: Secondary | ICD-10-CM

## 2015-05-12 DIAGNOSIS — Z23 Encounter for immunization: Secondary | ICD-10-CM | POA: Diagnosis not present

## 2015-05-12 DIAGNOSIS — S67190A Crushing injury of right index finger, initial encounter: Secondary | ICD-10-CM

## 2015-05-12 DIAGNOSIS — S67191A Crushing injury of left index finger, initial encounter: Secondary | ICD-10-CM | POA: Diagnosis not present

## 2015-05-12 DIAGNOSIS — T1490XA Injury, unspecified, initial encounter: Secondary | ICD-10-CM

## 2015-05-12 NOTE — Patient Instructions (Signed)

## 2015-05-12 NOTE — Progress Notes (Signed)
   Subjective:    Patient ID: Tony Gonzales, male    DOB: [birth year], 44 y.o.   MRN: [id number]  HPI DATE OF INJURY 05/12/15  Patient was at working and hit his left index finger with a sledge hammer- has laceration and nail bed injury.    Review of Systems  Constitutional: Negative.   HENT: Negative.   Respiratory: Negative.   Cardiovascular: Negative.   Gastrointestinal: Negative.   Genitourinary: Negative.   Musculoskeletal: Negative.   Neurological: Negative.   Psychiatric/Behavioral: Negative.   All other systems reviewed and are negative.      Objective:   Physical Exam  Constitutional: He appears well-developed and well-nourished. No distress.  Cardiovascular: Normal rate, regular rhythm and normal heart sounds.   Pulmonary/Chest: Effort normal and breath sounds normal.  Neurological: He is alert.  Skin: Skin is warm and dry.  1cm  laceration to left index finger tip X2- contusion of nail bed  Psychiatric: He has a normal mood and affect. His behavior is normal. Judgment and thought content normal.    BP 152/87 mmHg  Pulse 61  Temp(Src) 97.9 F (36.6 C) (Oral)  Ht  (1.854 m)  Wt 215 lb (97.523 kg)  BMI 28.37 kg/m2  Procedure:  Lidocaine 1% plain 2 ml local  Betadine prep  4-0 ethilon simple interrupted stitches X2 left index finger  Hole placed nail for drainage  Tubular dressing applied     Assessment & Plan:   1. Injury   2. Laceration of left index finger w/o foreign body with damage to nail, initial encounter    Wound care discussed No work restrictions other than to keep area clan and dry Watch for signs of infection Stitch removal in 10 days  Mary-Margaret Daphine Deutscher, FNP

## 2015-05-13 ENCOUNTER — Telehealth: Payer: Self-pay | Admitting: Nurse Practitioner

## 2015-05-13 NOTE — Telephone Encounter (Signed)
?   Did he get a return to note work for [employer] [facility]? Please advise

## 2015-05-13 NOTE — Telephone Encounter (Signed)
Ok for work note? 

## 2015-05-13 NOTE — Telephone Encounter (Signed)
Work note written and faxed to Adolph Pollack at [phone]

## 2015-05-22 ENCOUNTER — Ambulatory Visit (INDEPENDENT_AMBULATORY_CARE_PROVIDER_SITE_OTHER): Payer: Worker's Compensation | Admitting: Physician Assistant

## 2015-05-22 ENCOUNTER — Encounter: Payer: Self-pay | Admitting: Physician Assistant

## 2015-05-22 VITALS — BP 133/88 | HR 78 | Temp 97.6°F | Ht 73.0 in | Wt 208.0 lb

## 2015-05-22 DIAGNOSIS — S61311D Laceration without foreign body of left index finger with damage to nail, subsequent encounter: Secondary | ICD-10-CM

## 2015-05-22 DIAGNOSIS — Z4802 Encounter for removal of sutures: Secondary | ICD-10-CM | POA: Diagnosis not present

## 2015-05-22 NOTE — Progress Notes (Signed)
   Subjective:    Patient ID: Tony Gonzales, male    DOB: [birth year], 44 y.o.   MRN: [id number]  HPI 44 y/o male presents for suture removal s/p hitting his left  index finger with a sledgehammer while at work on 05/12/15. He has no complaints today.     Review of Systems  Constitutional: Negative.   HENT: Negative.   Eyes: Negative.   Respiratory: Negative.   Cardiovascular: Negative.   Gastrointestinal: Negative.   Endocrine: Negative.   Genitourinary: Negative.   Musculoskeletal: Negative.   Skin: Positive for color change.  Allergic/Immunologic: Negative.   Neurological: Negative.   Hematological: Negative.   Psychiatric/Behavioral: Negative.        Objective:   Physical Exam  Constitutional: He is oriented to person, place, and time.  Musculoskeletal: Normal range of motion. He exhibits tenderness. He exhibits no edema.  Neurological: He is alert and oriented to person, place, and time.  Skin:  Ecchymosis on left index finger. No dehiscence of wound   Psychiatric: He has a normal mood and affect. His behavior is normal. Judgment and thought content normal.          Assessment & Plan:  1. Visit for suture removal No further treatment required . May continue working with no restrictions.    F/U prn   Tiffany A. Chauncey Reading PA-C

## 2015-05-25 ENCOUNTER — Encounter: Payer: Self-pay | Admitting: Family Medicine

## 2015-05-25 ENCOUNTER — Ambulatory Visit (INDEPENDENT_AMBULATORY_CARE_PROVIDER_SITE_OTHER): Payer: BC Managed Care – PPO | Admitting: Family Medicine

## 2015-05-25 VITALS — BP 122/80 | Temp 98.5°F | Ht 73.0 in | Wt 207.0 lb

## 2015-05-25 DIAGNOSIS — R1032 Left lower quadrant pain: Secondary | ICD-10-CM | POA: Diagnosis not present

## 2015-05-25 MED ORDER — METRONIDAZOLE 500 MG PO TABS: 500.0000 mg | ORAL_TABLET | Freq: Three times a day (TID) | ORAL | Status: DC

## 2015-05-25 MED ORDER — CIPROFLOXACIN HCL 500 MG PO TABS: 500.0000 mg | ORAL_TABLET | Freq: Two times a day (BID) | ORAL | Status: AC

## 2015-05-25 NOTE — Progress Notes (Signed)
   Subjective:    Patient ID: Tony Gonzales, male    DOB: [birth year], 44 y.o.   MRN: [id number]  Abdominal Pain This is a new problem. The current episode started in the past 7 days. The problem occurs constantly. The problem has been unchanged. The pain is located in the LLQ. The pain is moderate. The quality of the pain is aching. The abdominal pain does not radiate. Associated symptoms include diarrhea. The pain is aggravated by eating. The pain is relieved by liquids. Treatments tried: increased fluids. The treatment provided mild relief.   Patient has no other concerns at this time.  LLQ DISCOMFORT CAME ON ABOUT two wks ago. Ate liquids, No abx for finger did liquids  Low gr tenderness, with discomfort has lost 15 pounds or so next  2 weeks progressive abdominal pain left lower quadrant. Minimal radiation.  Diesel meffh Review of Systems  Gastrointestinal: Positive for abdominal pain and diarrhea.   No change in urinary or bowel habits no dysuria no rash    Objective:   Physical Exam  Alert vitals stable no acute distress lungs clear heart rhythm no [place] abdomen left for quadrant tenderness no rebound no guarding bowel sounds present      Assessment & Plan:  Impression probable subacute diverticulitis discussed at great length. At least 10 questions answered 25 minutes most in discussion plan Cipro twice a day 10 days Flagyl 3 times a day 10 days. For liquids next few days advanced slowly warning signs discussed carefully numerous questions answered WSL

## 2015-06-08 ENCOUNTER — Telehealth: Payer: Self-pay | Admitting: Family Medicine

## 2015-06-08 MED ORDER — MAGIC MOUTHWASH: ORAL | Status: DC

## 2015-06-08 NOTE — Telephone Encounter (Signed)
Pt was seen a couple weeks ago and was put on antibiotics patient now has oral thrush and is needing something called in for it.   [facility]

## 2015-06-08 NOTE — Telephone Encounter (Signed)
Yes can swish and swallow this

## 2015-06-08 NOTE — Telephone Encounter (Signed)
Dukes m m wash 16 oz one tablespoon swish and spit qid

## 2015-06-08 NOTE — Telephone Encounter (Signed)
Called patient to inform him that we were sending in Duke's mouthwash. Patient asked if we could send in a solution that he could swish and swallow because he feels he may have thrush in his throat also.

## 2015-06-09 ENCOUNTER — Telehealth: Payer: Self-pay | Admitting: Family Medicine

## 2015-06-09 NOTE — Telephone Encounter (Signed)
Patient said his diverticulitis symptoms are subsiding, but he has developed thrush he believes.  He is requesting that we send in a Nystatin powder to be called in.   He said the [employer] HealthMagic Mouth Wash isn't helping.  Tony Gonzales

## 2015-06-09 NOTE — Telephone Encounter (Signed)
Nystatin susp 16 oz one tablespoon swish and swallow qid

## 2015-06-10 MED ORDER — NYSTATIN 100000 UNIT/ML MT SUSP: OROMUCOSAL | Status: DC

## 2015-06-10 NOTE — Telephone Encounter (Signed)
Called patient and informed him per Dr.Steve Luking that we sent into Nystatin susp for patient to swish and swallow one tablespoon 4x a day. Patient verbalized understanding.

## 2015-06-10 NOTE — Telephone Encounter (Signed)
Patient called and requested another medication be sent in. See other telephone call.

## 2015-06-24 ENCOUNTER — Ambulatory Visit (INDEPENDENT_AMBULATORY_CARE_PROVIDER_SITE_OTHER): Payer: BC Managed Care – PPO | Admitting: Nurse Practitioner

## 2015-06-24 ENCOUNTER — Encounter: Payer: Self-pay | Admitting: Nurse Practitioner

## 2015-06-24 VITALS — BP 143/81 | HR 74 | Temp 97.3°F | Ht 73.0 in | Wt 193.0 lb

## 2015-06-24 DIAGNOSIS — R197 Diarrhea, unspecified: Secondary | ICD-10-CM | POA: Diagnosis not present

## 2015-06-24 DIAGNOSIS — K5732 Diverticulitis of large intestine without perforation or abscess without bleeding: Secondary | ICD-10-CM | POA: Insufficient documentation

## 2015-06-24 NOTE — Progress Notes (Signed)
Primary Care Physician:  Tony SouthSteve Luking, MD Primary Gastroenterologist:  Dr. Darrick Gonzales  Chief Complaint  Patient presents with  . Diarrhea    HPI:   44 year old male presents on referral from primary care for diarrhea. Last seen by PCP on 05/25/2015 for abdominal pain which was new and located left lower quadrant. Diagnosed with probable subacute diverticulitis and was prescribed Cipro twice a day for 10 days and Flagyl 3 times a day for 10 days.  Today he states he has a history of diverticulitis with hospitalization a few years ago. Had another flare last year which self-resolve with bowel rest and eventual low residue diet. This most recent flare did not self resolve so her saw his PCP and was given antibiotics. Pain is much improved. Continues to have diarrhea with some residual cramping. Normally has 1-2 bowel movements a day and this was the case even after antibiotics. Had 15 bowel movements on Saturday and 8-10 Sunday. 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 Stools are watery with stool "not just all water." Denies hematochezia and melena N/V, fever, chills. Denies chest pain, dyspnea, dizziness, lightheadedness, syncope, near syncope. Denies any other upper or lower GI symptoms.    Past Medical History  Diagnosis Date  . GERD (gastroesophageal reflux disease)   . Colitis 2010  . Seasonal allergies   . Hiatal hernia     No past surgical history on file.  Current Outpatient Prescriptions  Medication Sig Dispense Refill  . magic mouthwash SOLN Swish and spit one tablespoon by mouth 4 times a day. (Patient not taking: Reported on 06/24/2015) 60 mL 0  . metroNIDAZOLE (FLAGYL) 500 MG tablet Take 1 tablet (500 mg total) by mouth 3 (three) times daily. (Patient not taking: Reported on 06/24/2015) 30 tablet 0  . nystatin (MYCOSTATIN) 100000 UNIT/ML suspension Swish and swallow one table spoon by mouth 4 times a day. (Patient not taking: Reported on 06/24/2015) 120  mL 0   No current facility-administered medications for this visit.    Allergies as of 06/24/2015  . (No Known Allergies)    Family History  Problem Relation Age of Onset  . Cancer Father     Bladder cancer  . Coronary artery disease Father     In his 6950's    Social History   Social History  . Marital Status: Married    Spouse Name: N/A  . Number of Children: N/A  . Years of Education: N/A   Occupational History  . Games developerDiesel mechanic    Social History Main Topics  . Smoking status: Never Smoker   . Smokeless tobacco: Not on file  . Alcohol Use: No  . Drug Use: No  . Sexual Activity: Not on file   Other Topics Concern  . Not on file   Social History Narrative   Wife is a Engineer, civil (consulting)nurse   No regular exercise    Review of Systems: 10-point ROS negative except as per HPI    Physical Exam: BP 143/81 mmHg  Pulse 74  Temp(Src) 97.3 F (36.3 C) (Oral)  Ht 6\' 1"  (1.854 m)  Wt 193 lb (87.544 kg)  BMI 25.47 kg/m2 General:   Alert and oriented. Pleasant and cooperative. Well-nourished and well-developed. Seems anxious. Head:  Normocephalic and atraumatic. Eyes:  Without icterus, sclera clear and conjunctiva pink.  Ears:  Normal auditory acuity. Cardiovascular:  S1, S2 present without murmurs appreciated. Extremities without clubbing or edema. Respiratory:  Clear to auscultation bilaterally. No wheezes,  rales, or rhonchi. No distress.  Gastrointestinal:  +BS, soft, non-tender and non-distended. No HSM noted. No guarding or rebound. No masses appreciated.  Rectal:  Deferred  Neurologic:  Alert and oriented x4;  grossly normal neurologically. Psych:  Alert and cooperative. Normal mood and affect.    06/24/2015 10:35 AM

## 2015-06-24 NOTE — Patient Instructions (Signed)
1. Continue taking her probiotic once a day. 2. Start moving toward a regular diet. 3. Call and notify us if you have more diarrhea or abdominal pain returns or worsens. 4. Return for follow-up in 2 months.

## 2015-06-24 NOTE — Progress Notes (Signed)
cc'ed to pcp °

## 2015-06-24 NOTE — Assessment & Plan Note (Signed)
44 year old male with a history of diverticulitis including a single hospitalization and a previous episode which self resolved. He presented to his PCP with abdominal pain which is becoming worse and was diagnosed and treated for subacute diverticulitis. He completed his Cipro and Flagyl, his pain has subsided. His bowel movements were normal after starting and finishing the antibiotics. However couple days after finishing his treatments he had diarrhea for approximately 2 days which was watery and nonbloody. Diarrhea has since resolved. This is likely coincidental viral gastroenteritis, food intolerance, or very brief postinfectious diarrhea. This point there is nothing to culture in the lab for stool studies as she is daughter having diarrhea. Recommended continue his probiotic, call if he has further recurrence of diarrhea which point we can send stool samples. He is still on a very minimal bland diet with subsequent weight loss. At this point he can move back toward her regular diet. Return for follow-up in 2 months.

## 2015-06-24 NOTE — Assessment & Plan Note (Signed)
Patient with 2 days of diarrhea this past weekend which is since resolved. Recommend continue probiotic, call further diarrhea which point we can send stool samples to the lab. Recommend moving toward an resuming a regular diet as she is still eating very minimally, low residue diet was subsequently loss. Return for follow-up in 2 months for region reevaluate his weight.

## 2015-07-09 ENCOUNTER — Encounter: Payer: Self-pay | Admitting: Family Medicine

## 2015-07-09 ENCOUNTER — Ambulatory Visit (INDEPENDENT_AMBULATORY_CARE_PROVIDER_SITE_OTHER): Payer: BC Managed Care – PPO | Admitting: Family Medicine

## 2015-07-09 VITALS — BP 120/80 | Temp 98.3°F | Ht 73.0 in | Wt 190.0 lb

## 2015-07-09 DIAGNOSIS — R197 Diarrhea, unspecified: Secondary | ICD-10-CM | POA: Diagnosis not present

## 2015-07-09 DIAGNOSIS — R1084 Generalized abdominal pain: Secondary | ICD-10-CM | POA: Diagnosis not present

## 2015-07-09 DIAGNOSIS — R195 Other fecal abnormalities: Secondary | ICD-10-CM | POA: Diagnosis not present

## 2015-07-09 DIAGNOSIS — R634 Abnormal weight loss: Secondary | ICD-10-CM | POA: Diagnosis not present

## 2015-07-09 NOTE — Progress Notes (Signed)
   Subjective:    Patient ID: Tony Gonzales, male    DOB: 09/24/1970, 44 y.o.   MRN: [id number]  Abdominal Pain This is a new problem. The current episode started more than 1 month ago. The problem occurs intermittently. The problem has been unchanged. The pain is located in the generalized abdominal region. The pain is moderate. The abdominal pain does not radiate. Associated symptoms comments: Weight loss. Nothing aggravates the pain. The pain is relieved by nothing. Treatments tried: antibiotics. The treatment provided no relief.   all of this started back in September at first it was presumed to be diverticulitis he was treated with antibiotics he had extensive diarrhea afterwords in addition to this patient has had several months of intermittent abdominal pains decrease appetite and weight loss of over 20 pounds.  this patient is been experiencing some weight loss. He is experiencing intermittent abdominal discomforts and sometimes back discomforts. Denies wheezing or shortness of breath.  Review of Systems  Gastrointestinal: Positive for abdominal pain.   patient having mucousy stools intermittently loose stools sometimes very thin pencillike stools he states this is a dramatic change compared to September. Denies reflux denies chest pressure tightness pain denies wheezing denies fever chills or night sweats. Pay states that he has not been able to eat as well because of these abdominal discomforts he thinks that part of his weight loss.     Objective:   Physical Exam   lungs are clear hearts regular abdomen soft no guarding or rebound some generalized abdominal discomfort but no masses are felt extremities no edema skin warm dry   the patient was seen by local gastroenterology nurse practitioner who did not feel there was any specific issue going on in did not recommend any type of follow-up patient comes here today because of frustration about ongoing symptoms ongoing poor appetite poor  by mouth intake as well as weight loss.    Assessment & Plan:   intermittent abdominal pain with weight loss I recommend lab work I recommend extensive stool studies to rule out celiac disease, C. Difficile, other problems. It could be inflammatory bowel disease causing this because of mucousy stools if all of his stools studies and lab work looks good I would recommend colonoscopy to rule out colitis or inflammatory bowel disease I doubt cancer.  this patient is extremely nice. He is just seeking help for his problem.  I [place] a second opinion [place]

## 2015-07-12 LAB — CBC WITH DIFFERENTIAL/PLATELET
BASOS: 0 %
Basophils Absolute: 0 10*3/uL (ref 0.0–0.2)
EOS (ABSOLUTE): 0.1 10*3/uL (ref 0.0–0.4)
EOS: 2 %
HEMATOCRIT: 43.1 % (ref 37.5–51.0)
HEMOGLOBIN: 14.5 g/dL (ref 12.6–17.7)
IMMATURE GRANULOCYTES: 0 %
Immature Grans (Abs): 0 10*3/uL (ref 0.0–0.1)
LYMPHS ABS: 2.1 10*3/uL (ref 0.7–3.1)
Lymphs: 32 %
MCH: 27.4 pg (ref 26.6–33.0)
MCHC: 33.6 g/dL (ref 31.5–35.7)
MCV: 81 fL (ref 79–97)
MONOCYTES: 10 %
MONOS ABS: 0.6 10*3/uL (ref 0.1–0.9)
NEUTROS PCT: 56 %
Neutrophils Absolute: 3.5 10*3/uL (ref 1.4–7.0)
Platelets: 268 10*3/uL (ref 150–379)
RBC: 5.3 x10E6/uL (ref 4.14–5.80)
RDW: 14.3 % (ref 12.3–15.4)
WBC: 6.4 10*3/uL (ref 3.4–10.8)

## 2015-07-12 LAB — BASIC METABOLIC PANEL
BUN/Creatinine Ratio: 9 (ref 9–20)
BUN: 7 mg/dL (ref 6–24)
CALCIUM: 9.6 mg/dL (ref 8.7–10.2)
CO2: 26 mmol/L (ref 18–29)
CREATININE: 0.81 mg/dL (ref 0.76–1.27)
Chloride: 102 mmol/L (ref 97–106)
GFR, EST AFRICAN AMERICAN: 127 mL/min/{1.73_m2} (ref >59)
GFR, EST NON AFRICAN AMERICAN: 110 mL/min/{1.73_m2} (ref >59)
Glucose: 87 mg/dL (ref 65–99)
Potassium: 4.1 mmol/L (ref 3.5–5.2)
Sodium: 143 mmol/L (ref 136–144)

## 2015-07-12 LAB — HEPATIC FUNCTION PANEL
ALK PHOS: 63 IU/L (ref 39–117)
ALT: 29 IU/L (ref 0–44)
AST: 20 IU/L (ref 0–40)
Albumin: 4.9 g/dL (ref 3.5–5.5)
BILIRUBIN, DIRECT: 0.11 mg/dL (ref 0.00–0.40)
Bilirubin Total: 0.3 mg/dL (ref 0.0–1.2)
Total Protein: 7.2 g/dL (ref 6.0–8.5)

## 2015-07-12 LAB — TISSUE TRANSGLUTAMINASE, IGA

## 2015-07-12 LAB — LIPASE: Lipase: 48 U/L (ref 0–59)

## 2015-07-12 LAB — SEDIMENTATION RATE: Sed Rate: 2 mm/hr (ref 0–15)

## 2015-07-13 ENCOUNTER — Encounter: Payer: Self-pay | Admitting: Family Medicine

## 2015-07-14 LAB — CLOSTRIDIUM DIFFICILE BY PCR: Toxigenic C. Difficile by PCR: POSITIVE — AB

## 2015-07-15 ENCOUNTER — Other Ambulatory Visit: Payer: Self-pay

## 2015-07-15 MED ORDER — METRONIDAZOLE 500 MG PO TABS: 500.0000 mg | ORAL_TABLET | Freq: Three times a day (TID) | ORAL | Status: DC

## 2015-07-17 LAB — STOOL CULTURE: E coli, Shiga toxin Assay: NEGATIVE

## 2015-07-27 ENCOUNTER — Ambulatory Visit: Payer: BC Managed Care – PPO | Admitting: Nurse Practitioner

## 2015-07-30 LAB — POC HEMOCCULT BLD/STL (HOME/3-CARD/SCREEN)
Card #3 Fecal Occult Blood, POC: NEGATIVE
FECAL OCCULT BLD: NEGATIVE
FECAL OCCULT BLD: NEGATIVE

## 2015-08-11 ENCOUNTER — Ambulatory Visit (INDEPENDENT_AMBULATORY_CARE_PROVIDER_SITE_OTHER): Payer: BC Managed Care – PPO | Admitting: Family Medicine

## 2015-08-11 ENCOUNTER — Encounter: Payer: Self-pay | Admitting: Family Medicine

## 2015-08-11 VITALS — BP 132/86 | Temp 99.0°F | Ht 73.0 in | Wt 188.0 lb

## 2015-08-11 DIAGNOSIS — R109 Unspecified abdominal pain: Secondary | ICD-10-CM | POA: Diagnosis not present

## 2015-08-11 NOTE — Progress Notes (Signed)
Subjective:    Patient ID: Tony Gonzales, male    DOB: [birth year], 44 y.o.   MRN: [id number]  Abdominal Pain This is a recurrent problem. Episode onset: october. Associated symptoms include a fever. Treatments tried: cipro, 2 rounds of flagyl.  diagnosed with c diff on nov 21st.   Pain between shoulder blades for the past 2 -3 days.   Lost 35 lbs since October.   Sinus drainage, dry mouth, for the past 3 -4 days.   Patient went on to see the GI specialist here in town. Saw the PA for Dr. Buford Dresser. Was not particularly satisfied with cancer. At that time advised him he was on the healing from diverticulitis. Patient now states that he is worsening. Progressive left lower quadrant discomfort and diffuse aching. Stools loose at times. See prior notes. Prior workup revealed Clostridium difficile.  Review of Systems  Constitutional: Positive for fever.  Gastrointestinal: Positive for abdominal pain.       Objective:   Physical Exam Alert slightly anxious appearing no acute distress. Vitals stable. Lungs clear heart rare rhythm abdomen good bowel sounds discrete left lower quadrant tenderness no rebound no guarding  Results for orders placed or performed in visit on 07/09/15  Clostridium Difficile by PCR  Result Value Ref Range   Toxigenic C Difficile by pcr Positive (A) Negative  Stool culture  Result Value Ref Range   Salmonella/Shigella Screen Final report    RESULT 1 Comment    Campylobacter Culture Final report    RESULT 1 Comment    E coli, Shiga toxin Assay Negative Negative  Tissue Transglutaminase, IGA  Result Value Ref Range   Transglutaminase IgA <2 0 - 3 U/mL  Lipase  Result Value Ref Range   Lipase 48 0 - 59 U/L  Sed Rate (ESR)  Result Value Ref Range   Sed Rate 2 0 - 15 mm/hr  CBC with Differential/Platelet  Result Value Ref Range   WBC 6.4 3.4 - 10.8 x10E3/uL   RBC 5.30 4.14 - 5.80 x10E6/uL   Hemoglobin 14.5 12.6 - 17.7 g/dL   Hematocrit 43.1 37.5 - 51.0  %   MCV 81 79 - 97 fL   MCH 27.4 26.6 - 33.0 pg   MCHC 33.6 31.5 - 35.7 g/dL   RDW 14.3 12.3 - 15.4 %   Platelets 268 150 - 379 x10E3/uL   Neutrophils 56 %   Lymphs 32 %   Monocytes 10 %   Eos 2 %   Basos 0 %   Neutrophils Absolute 3.5 1.4 - 7.0 x10E3/uL   Lymphocytes Absolute 2.1 0.7 - 3.1 x10E3/uL   Monocytes Absolute 0.6 0.1 - 0.9 x10E3/uL   EOS (ABSOLUTE) 0.1 0.0 - 0.4 x10E3/uL   Basophils Absolute 0.0 0.0 - 0.2 x10E3/uL   Immature Granulocytes 0 %   Immature Grans (Abs) 0.0 0.0 - 0.1 [id number]/HY  Basic metabolic panel  Result Value Ref Range   Glucose 87 65 - 99 mg/dL   BUN 7 6 - 24 mg/dL   Creatinine, Ser 0.81 0.76 - 1.27 mg/dL   GFR calc non Af Amer 110 >59 mL/min/1.73   GFR calc Af Amer 127 >59 mL/min/1.73   BUN/Creatinine Ratio 9 9 - 20   Sodium 143 136 - 144 mmol/L   Potassium 4.1 3.5 - 5.2 mmol/L   Chloride 102 97 - 106 mmol/L   CO2 26 18 - 29 mmol/L   Calcium 9.6 8.7 - 10.2 mg/dL  Hepatic function panel  Result Value Ref Range   Total Protein 7.2 6.0 - 8.5 g/dL   Albumin 4.9 3.5 - 5.5 g/dL   Bilirubin Total 0.3 0.0 - 1.2 mg/dL   Bilirubin, Direct 0.11 0.00 - 0.40 mg/dL   Alkaline Phosphatase 63 39 - 117 IU/L   AST 20 0 - 40 IU/L   ALT 29 0 - 44 IU/L  POC Hemoccult Bld/Stl (3-Cd Home Screen)  Result Value Ref Range   Card #1 Date     Fecal Occult Blood, POC Negative Negative   Card #2 Date     Card #2 Fecal Occult Blod, POC Negative    Card #3 Date     Card #3 Fecal Occult Blood, POC Negative    C abdominal scan result     Assessment & Plan:  Impression somewhat complex abdominal pain/infection/potential diverticulitis presentation. Unfortunately were having difficulties pression on with GI referral as they screen his prior records. Patient has had weight loss progressive pain. Confirm C. difficile toxin. One round of Flagyl did seem to help. Patient would prefer to hold off on toxic medications at this time if at all possible plan CT scan abdomen and  pelvis further recommendations based results addendum results positive for colitis. This along with prior C. difficile and ongoing symptoms warrants a another round of antibiotics. Also GI referral 30 minutes spent most in discussion regarding this complex patient along with discussion with referral nurse nurses radiology and insurance interaction to justify scan. WSL

## 2015-08-12 ENCOUNTER — Ambulatory Visit (HOSPITAL_COMMUNITY)
Admission: RE | Admit: 2015-08-12 | Discharge: 2015-08-12 | Disposition: A | Discharge status: Home / Self Care | Payer: BC Managed Care – PPO | Source: Ambulatory Visit | Attending: Family Medicine | Admitting: Family Medicine

## 2015-08-12 ENCOUNTER — Other Ambulatory Visit: Payer: Self-pay | Admitting: *Deleted

## 2015-08-12 DIAGNOSIS — R1013 Epigastric pain: Secondary | ICD-10-CM | POA: Diagnosis not present

## 2015-08-12 DIAGNOSIS — R933 Abnormal findings on diagnostic imaging of other parts of digestive tract: Secondary | ICD-10-CM | POA: Insufficient documentation

## 2015-08-12 DIAGNOSIS — Z8619 Personal history of other infectious and parasitic diseases: Secondary | ICD-10-CM | POA: Diagnosis not present

## 2015-08-12 LAB — BASIC METABOLIC PANEL
Anion gap: 7 (ref 5–15)
BUN: 12 mg/dL (ref 6–20)
CHLORIDE: 104 mmol/L (ref 101–111)
CO2: 28 mmol/L (ref 22–32)
CREATININE: 0.83 mg/dL (ref 0.61–1.24)
Calcium: 9.2 mg/dL (ref 8.9–10.3)
GFR calc Af Amer: >60 mL/min (ref >60)
GFR calc non Af Amer: >60 mL/min (ref >60)
GLUCOSE: 99 mg/dL (ref 65–99)
Potassium: 3.7 mmol/L (ref 3.5–5.1)
SODIUM: 139 mmol/L (ref 135–145)

## 2015-08-12 LAB — CBC WITH DIFFERENTIAL/PLATELET
Basophils Absolute: 0 10*3/uL (ref 0.0–0.1)
Basophils Relative: 0 %
EOS ABS: 0.2 10*3/uL (ref 0.0–0.7)
EOS PCT: 4 %
HCT: 44.9 % (ref 39.0–52.0)
HEMOGLOBIN: 15.3 g/dL (ref 13.0–17.0)
LYMPHS ABS: 1.8 10*3/uL (ref 0.7–4.0)
Lymphocytes Relative: 28 %
MCH: 28.4 pg (ref 26.0–34.0)
MCHC: 34.1 g/dL (ref 30.0–36.0)
MCV: 83.3 fL (ref 78.0–100.0)
MONOS PCT: 11 %
Monocytes Absolute: 0.7 10*3/uL (ref 0.1–1.0)
NEUTROS PCT: 57 %
Neutro Abs: 3.5 10*3/uL (ref 1.7–7.7)
Platelets: 257 10*3/uL (ref 150–400)
RBC: 5.39 MIL/uL (ref 4.22–5.81)
RDW: 13.2 % (ref 11.5–15.5)
WBC: 6.2 10*3/uL (ref 4.0–10.5)

## 2015-08-12 MED ORDER — IOHEXOL 300 MG/ML  SOLN
100.0000 mL | Freq: Once | INTRAMUSCULAR | Status: AC | PRN
  Administered 2015-08-12: 100 mL via INTRAVENOUS

## 2015-08-12 MED ORDER — METRONIDAZOLE 500 MG PO TABS: 500.0000 mg | ORAL_TABLET | Freq: Three times a day (TID) | ORAL | Status: DC

## 2015-08-13 ENCOUNTER — Encounter: Payer: Self-pay | Admitting: Gastroenterology

## 2015-08-28 ENCOUNTER — Ambulatory Visit: Payer: BC Managed Care – PPO | Admitting: Nurse Practitioner

## 2015-09-29 ENCOUNTER — Encounter: Payer: Self-pay | Admitting: Gastroenterology

## 2015-09-29 ENCOUNTER — Ambulatory Visit (INDEPENDENT_AMBULATORY_CARE_PROVIDER_SITE_OTHER): Payer: BC Managed Care – PPO | Admitting: Gastroenterology

## 2015-09-29 VITALS — BP 100/64 | HR 72 | Ht 72.0 in | Wt 186.5 lb

## 2015-09-29 DIAGNOSIS — IMO0001 Reserved for inherently not codable concepts without codable children: Secondary | ICD-10-CM

## 2015-09-29 DIAGNOSIS — Z86010 Personal history of colonic polyps: Secondary | ICD-10-CM | POA: Diagnosis not present

## 2015-09-29 DIAGNOSIS — Z8719 Personal history of other diseases of the digestive system: Secondary | ICD-10-CM | POA: Diagnosis not present

## 2015-09-29 DIAGNOSIS — K5732 Diverticulitis of large intestine without perforation or abscess without bleeding: Secondary | ICD-10-CM | POA: Diagnosis not present

## 2015-09-29 NOTE — Progress Notes (Signed)
HPI :  45 y/o male here for new patient evaluation for suspected diverticulitis and C Diff.   Patient reports he was hospitalized in 2009 or so for presumed diverticulitis of the left colon. He was treated with antibiotics and responded appropriately with resolution of symptoms at the time. Since that time he had not had recurrence until recent months.  He reports this past fall he has had recurrence of symptoms. He reported acute onset of LLQ abdominal pain with associated fevers. He did not have imaging at that time and he was treated empirically for diverticulitis with antibiotics (cipro and flagyl) for about one week. He reported he felt slightly better after one week, but symptoms quickly recurred he was treated then again for 10 days. He reported he felt a little better over that time following the second course of antibiotics. He reported some symptoms of loose stools had persisted over time with some ongoing crampy pains, and he tested positive for C diff, and was given another course of flagyl for this. He reports over the past several weeks he has gradually improved and now states he is feeling better.  He reports during the time of severe symptoms he had 30 lbs of weight loss. Over time he has had some weight gain but it has been slow to get his weight back to previous baseline.    He had a CT scan at the end of December showing distal colon wall thickening which was thought to have been due to C diff. At present time he is having one BM per day. No blood in the stools. Stool is formed. He is not having much pain like he was before. He thinks this has gotten much better. This is the second time he has had diverticulitis in general. He is eating okay otherwise without postprandial complaints.   He has had a prior flex sig and full colonoscopy which has showed left sided diverticulosis. 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    No FH of CRC. He denies any FH of crohns or colitis.       Past Medical History  Diagnosis Date  . GERD (gastroesophageal reflux disease)   . C. difficile diarrhea 2010  . Seasonal allergies   . Hiatal hernia   . Diverticulitis   . Colon polyps      History reviewed. No pertinent past surgical history.   Family History  Problem Relation Age of Onset  . Bladder Cancer Father   . Coronary artery disease Father     In his 80's  . Colon cancer Neg Hx    Social History  Substance Use Topics  . Smoking status: Former Smoker    Types: Cigarettes    Quit date: 06/24/1995  . Smokeless tobacco: Former Neurosurgeon    Types: Chew    Quit date: 06/24/1995  . Alcohol Use: No   Current Outpatient Prescriptions  Medication Sig Dispense Refill  . Probiotic Product (PROBIOTIC PO) Take by mouth.    . vitamin C (ASCORBIC ACID) 500 MG tablet Take 500 mg by mouth daily.    . TURMERIC PO Take 1 tablet by mouth daily. Reported on 09/29/2015     No current facility-administered medications for this visit.   No Known Allergies   Review of Systems: All systems reviewed and negative except where noted in HPI.  Lab Results  Component Value Date   WBC 6.2 08/12/2015   HGB 15.3 08/12/2015   HCT 44.9 08/12/2015   MCV 83.3 08/12/2015   PLT 257 08/12/2015    Lab Results  Component Value Date   CREATININE 0.83 08/12/2015   BUN 12 08/12/2015   NA 139 08/12/2015   K 3.7 08/12/2015   CL 104 08/12/2015   CO2 28 08/12/2015   Lab Results  Component Value Date   WBC 6.2 08/12/2015   HGB 15.3 08/12/2015   HCT 44.9 08/12/2015   MCV 83.3 08/12/2015   PLT 257 08/12/2015     Physical Exam: BP 100/64 mmHg  Pulse 72  Ht 6' (1.829 m)  Wt 186 lb 8 oz (84.596 kg)  BMI 25.29 kg/m2 Constitutional: Pleasant,well-developed, male in no acute distress. HEENT: Normocephalic and atraumatic. Conjunctivae are normal. No scleral icterus. Neck  supple.  Cardiovascular: Normal rate, regular rhythm.  Pulmonary/chest: Effort normal and breath sounds normal. No wheezing, rales or rhonchi. Abdominal: Soft, nondistended, nontender. Bowel sounds active throughout. There are no masses palpable. No hepatomegaly. Extremities: no edema Lymphadenopathy: No cervical adenopathy noted. Neurological: Alert and oriented to person place and time. Skin: Skin is warm and dry. No rashes noted. Psychiatric: Normal mood and affect. Behavior is normal.   ASSESSMENT AND PLAN: 45 y/o male with a history of colon adenoma removed in 2009, with a remote history of reported left sided diverticulitis around that time, who had a recurrence of diverticulitis which took 2 courses of antibiotics to find relief. Following this episode his course was complicated by C Diff for which he received treatment with flagyl. Over time his symptoms have gradually resolved and he now feels back to baseline.   We discussed diverticulitis and the natural history of this, and risks for for recurrence moving forward. We discussed how if this continues to recur he may wish to consider surgical resection however after having 2 episodes over 8 years, wishes to monitor for now and would have low threshold to retreat him if symptoms recur. He should take a daily fiber supplement for this. We also discussed C Diff as well. If he has recurrence of loose stools he is to contact me for reassessment.   Otherwise, he has a history of tubular adenoma in 2009 and has not had a surveillance colonoscopy since that time, and is overdue for this. Recommend colonoscopy at this time in light of this issue for surveillance purposes, and will clarify CT findings to ensure no IBD but suspect this was more than likely due changes from C diff.  We will also reassess his diverticulosis burden. He is now far enough out from his episode of diverticulitis where safe to proceed. The indications, risks, and benefits of  colonoscopy were explained to the patient in detail. Risks include but are not limited to bleeding, perforation, adverse reaction to medications, and cardiopulmonary compromise. Sequelae include but are not limited to the possibility of surgery, hospitalization, and mortality. The patient verbalized understanding and wished to proceed. All questions answered, referred to the scheduler and bowel prep ordered. Further recommendations pending results of the exam.     Ileene Patrick, MD [facility] Pager [phone]  CC: Babs Sciara, MD

## 2015-09-29 NOTE — Patient Instructions (Signed)

## 2015-10-07 ENCOUNTER — Other Ambulatory Visit: Payer: Self-pay

## 2015-10-07 ENCOUNTER — Telehealth: Payer: Self-pay | Admitting: Gastroenterology

## 2015-10-07 DIAGNOSIS — K5732 Diverticulitis of large intestine without perforation or abscess without bleeding: Secondary | ICD-10-CM

## 2015-10-07 MED ORDER — NA SULFATE-K SULFATE-MG SULF 17.5-3.13-1.6 GM/177ML PO SOLN: ORAL | Status: DC

## 2015-10-07 NOTE — Telephone Encounter (Signed)
sent 

## 2015-10-21 ENCOUNTER — Ambulatory Visit (AMBULATORY_SURGERY_CENTER): Payer: BC Managed Care – PPO | Admitting: Gastroenterology

## 2015-10-21 ENCOUNTER — Encounter: Payer: Self-pay | Admitting: Gastroenterology

## 2015-10-21 VITALS — BP 100/67 | HR 70 | Temp 98.4°F | Resp 10 | Ht 73.0 in | Wt 193.0 lb

## 2015-10-21 DIAGNOSIS — Z86010 Personal history of colonic polyps: Secondary | ICD-10-CM

## 2015-10-21 DIAGNOSIS — Z8719 Personal history of other diseases of the digestive system: Secondary | ICD-10-CM

## 2015-10-21 DIAGNOSIS — R197 Diarrhea, unspecified: Secondary | ICD-10-CM

## 2015-10-21 MED ORDER — SODIUM CHLORIDE 0.9 % IV SOLN: 500.0000 mL | INTRAVENOUS | Status: DC

## 2015-10-21 NOTE — Op Note (Signed)
[facility] [address] [place], [address]   COLONOSCOPY PROCEDURE REPORT  PATIENT: Tony Gonzales, Tony Gonzales  MR#: [id number] BIRTHDATE: April 09, 1971 , 45  yrs. old GENDER: male ENDOSCOPIST: Benancio Deeds, MD REFERRED BY: Lubertha South MD PROCEDURE DATE:  10/21/2015 PROCEDURE:   Colonoscopy, surveillance First Screening Colonoscopy - Avg.  risk and is 50 yrs.  old or older - No.  Prior Negative Screening - Now for repeat screening. N/A  History of Adenoma - Now for follow-up colonoscopy & has been > or = to 3 yrs.  Yes hx of adenoma.  Has been 3 or more years since last colonoscopy.  Polyps removed today? No ASA CLASS:   Class II INDICATIONS:Surveillance due to prior colonic neoplasia and Colorectal Neoplasm Risk Assessment for this procedure is average risk. History of adenoma in 2009 at young age, also with history of diverticulitis of the left colon and history of C Diff MEDICATIONS: Propofol 400 mg IV  DESCRIPTION OF PROCEDURE:   After the risks benefits and alternatives of the procedure were thoroughly explained, informed consent was obtained.  The digital rectal exam revealed no abnormalities of the rectum.   The LB [id number]  endoscope was introduced through the anus and advanced to the cecum, which was identified by both the appendix and ileocecal valve. No adverse events experienced.   The quality of the prep was adequate  The instrument was then slowly withdrawn as the colon was fully examined. Estimated blood loss is zero unless otherwise noted in this procedure report.  COLON FINDINGS: There was mild diverticulosis of the sigmoid colon. Otherwise the remainder of the examined colon was normal.  No polyps or mass lesions noted.  No inflammatory changes. Retroflexed views revealed no abnormalities. The time to cecum = 4.3 Withdrawal time = 12.3   The scope was withdrawn and the procedure completed. COMPLICATIONS: There were no immediate  complications.  ENDOSCOPIC IMPRESSION: Mild diverticulosis of the sigmoid colon Otherwise normal colonoscopy, no polyps  RECOMMENDATIONS: Resume diet Resume medications Consider repeat colonoscopy in 5 years for surveillance purposes given history of adenoma at age 20, despite negative exam today   eSigned:  Benancio Deeds, MD 10/21/2015 2:33 PM   cc:  Lubertha South MD, the patient

## 2015-10-21 NOTE — Patient Instructions (Signed)
YOU HAD AN ENDOSCOPIC PROCEDURE TODAY AT THE [facility]:   Refer to the procedure report that was given to you for any specific questions about what was found during the examination.  If the procedure report does not answer your questions, please call your gastroenterologist to clarify.  If you requested that your care partner not be given the details of your procedure findings, then the procedure report has been included in a sealed envelope for you to review at your convenience later.  YOU SHOULD EXPECT: Some feelings of bloating in the abdomen. Passage of more gas than usual.  Walking can help get rid of the air that was put into your GI tract during the procedure and reduce the bloating. If you had a lower endoscopy (such as a colonoscopy or flexible sigmoidoscopy) you may notice spotting of blood in your stool or on the toilet paper. If you underwent a bowel prep for your procedure, you may not have a normal bowel movement for a few days.  Please Note:  You might notice some irritation and congestion in your nose or some drainage.  This is from the oxygen used during your procedure.  There is no need for concern and it should clear up in a day or so.  SYMPTOMS TO REPORT IMMEDIATELY:   Following lower endoscopy (colonoscopy or flexible sigmoidoscopy):  Excessive amounts of blood in the stool  Significant tenderness or worsening of abdominal pains  Swelling of the abdomen that is new, acute  Fever of 100F or higherls   For urgent or emergent issues, a gastroenterologist can be reached at any hour by calling [phone].   DIET: Your first meal following the procedure should be a small meal and then it is ok to progress to your normal diet. Heavy or fried foods are harder to digest and may make you feel nauseous or bloated.  Likewise, meals heavy in dairy and vegetables can increase bloating.  Drink plenty of fluids but you should avoid alcoholic beverages for 24  hours.  ACTIVITY:  You should plan to take it easy for the rest of today and you should NOT DRIVE or use heavy machinery until tomorrow (because of the sedation medicines used during the test).    FOLLOW UP: Our staff will call the number listed on your records the next business day following your procedure to check on you and address any questions or concerns that you may have regarding the information given to you following your procedure. If we do not reach you, we will leave a message.  However, if you are feeling well and you are not experiencing any problems, there is no need to return our call.  We will assume that you have returned to your regular daily activities without incident.  If any biopsies were taken you will be contacted by phone or by letter within the next 1-3 weeks.  Please call us at [phone] if you have not heard about the biopsies in 3 weeks.    SIGNATURES/CONFIDENTIALITY: You and/or your care partner have signed paperwork which will be entered into your electronic medical record.  These signatures attest to the fact that that the information above on your After Visit Summary has been reviewed and is understood.  Full responsibility of the confidentiality of this discharge information lies with you and/or your care-partner.  Thank you for letting us take care of your health care needs.

## 2015-10-21 NOTE — Progress Notes (Signed)
A/ox3, pleased with MAC, report to RN 

## 2015-10-22 ENCOUNTER — Telehealth: Payer: Self-pay

## 2015-10-22 NOTE — Telephone Encounter (Signed)
  Follow up Call-  Call back number 10/21/2015  Post procedure Call Back phone  # [phone]  Permission to leave phone message Yes     Patient questions:  Do you have a fever, pain , or abdominal swelling? No. Pain Score  0 *  Have you tolerated food without any problems? Yes.    Have you been able to return to your normal activities? Yes.    Do you have any questions about your discharge instructions: Diet   No. Medications  No. Follow up visit  No.  Do you have questions or concerns about your Care? No.  Actions: * If pain score is 4 or above: No action needed, pain <4.

## 2016-01-16 IMAGING — CT CT ABD-PELV W/ CM
2 of 5 series · 16 of 46 positions shown, 18 images · IV contrast (Omnipaque 300)
Comparison: April 10, 2009

CLINICAL DATA: For week history of epigastric region pain. Recent
Clostridium difficile colitis

EXAM:
CT ABDOMEN AND PELVIS WITH CONTRAST
TECHNIQUE: Multidetector CT imaging of the abdomen and pelvis was performed
using the standard protocol following bolus administration of
intravenous contrast. Oral contrast was also administered.
CONTRAST:  100mL OMNIPAQUE IOHEXOL 300 MG/ML  SOLN

[Series 2: abd_pel_with 5.0 b40f · axial · 0.79mm/px · z∈[-512,-112]mm · 13 of 90 slices shown, 15 images]
[im 5/90  soft-tissue]
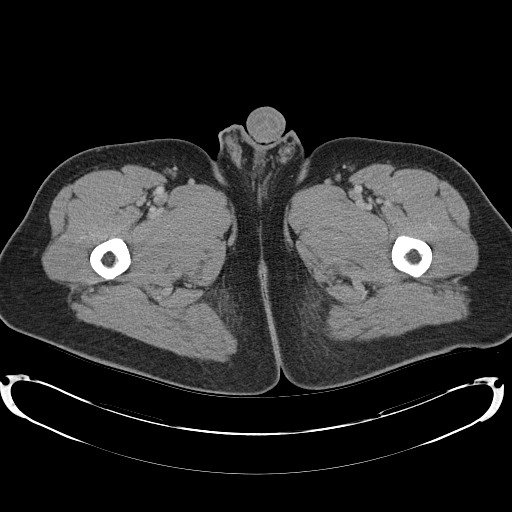
[im 5/90  bone]
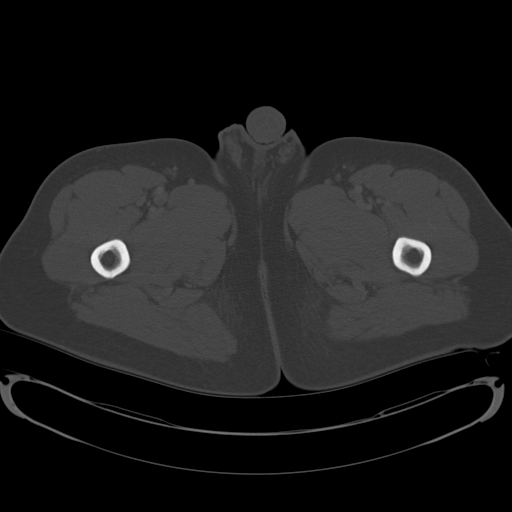
[im 14/90  soft-tissue]
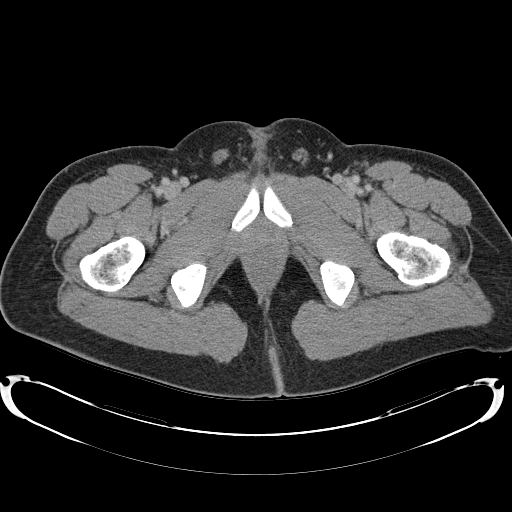
[im 18/90  soft-tissue]
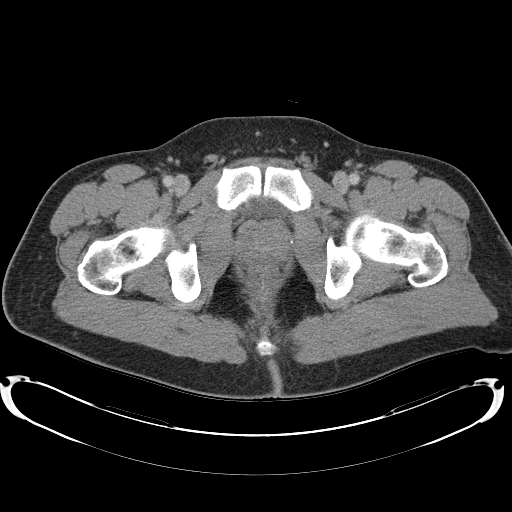
[im 27/90  soft-tissue]
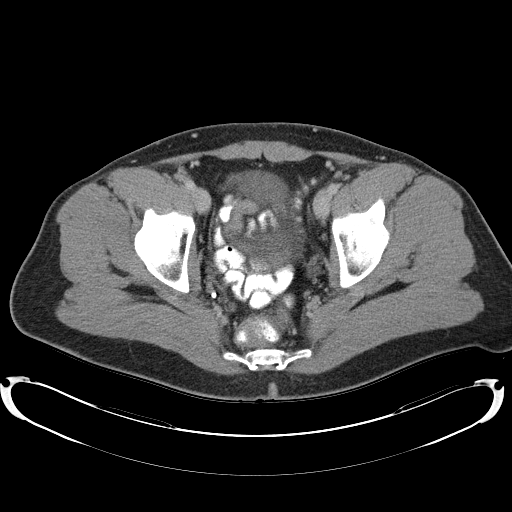
[im 32/90  soft-tissue]
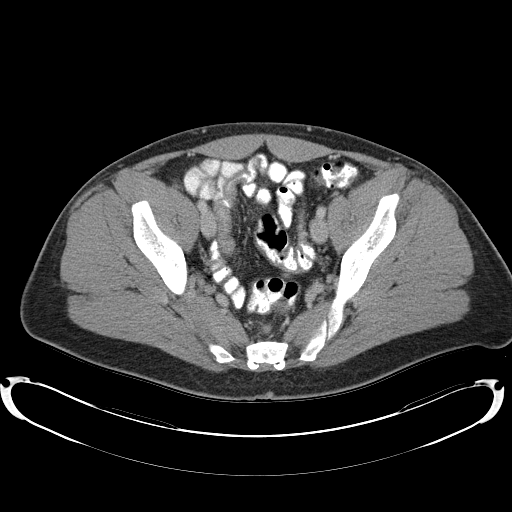
[im 41/90  soft-tissue]
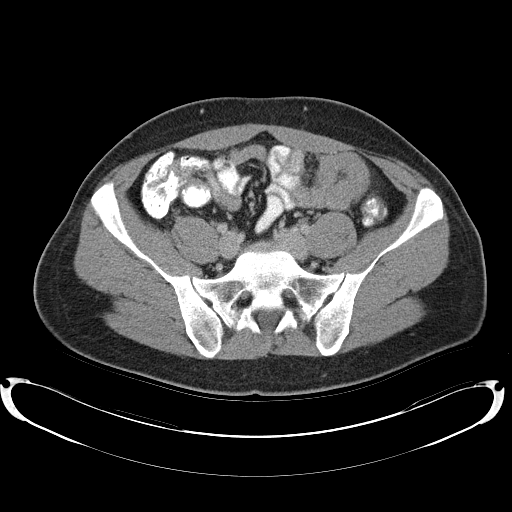
[im 45/90  soft-tissue]
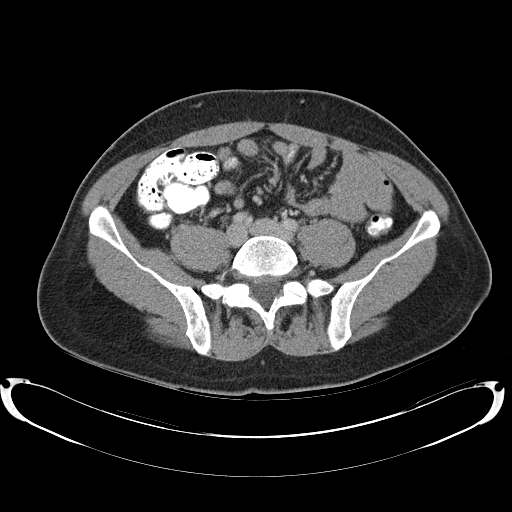
[im 49/90  soft-tissue]
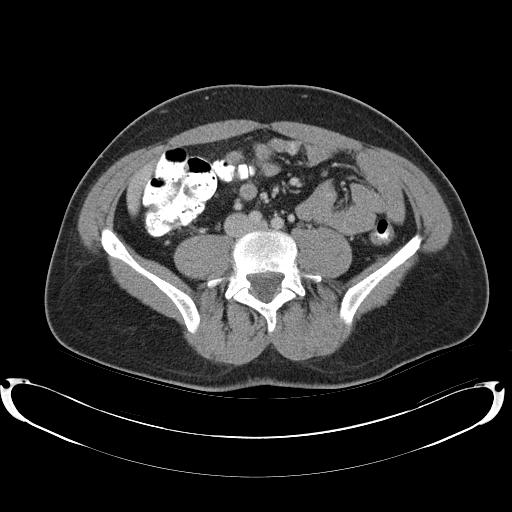
[im 58/90  soft-tissue]
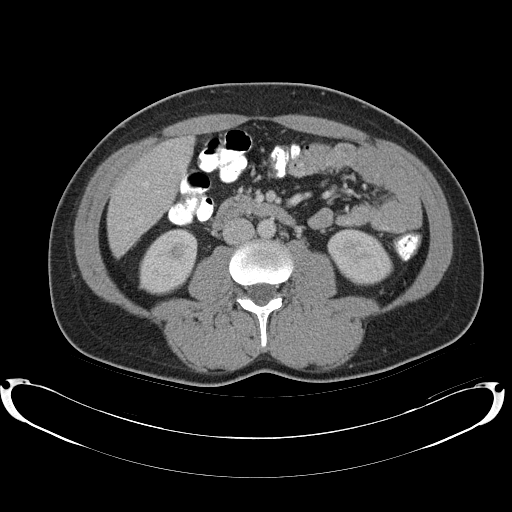
[im 58/90  bone]
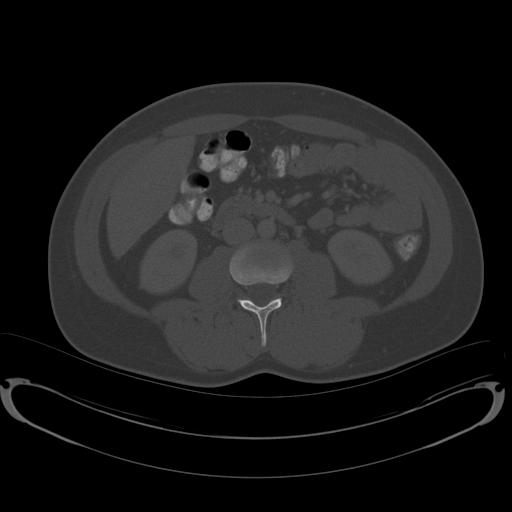
[im 63/90  soft-tissue]
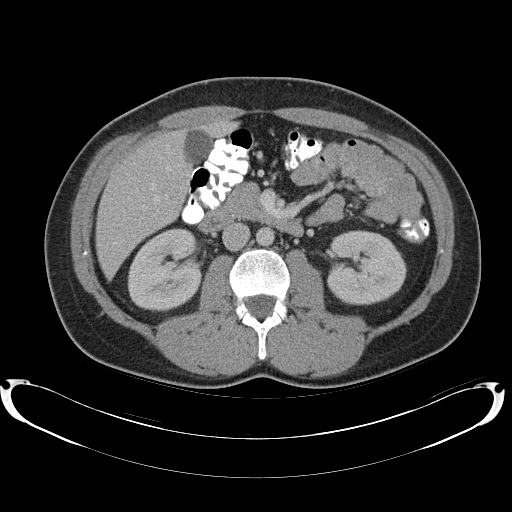
[im 72/90  soft-tissue]
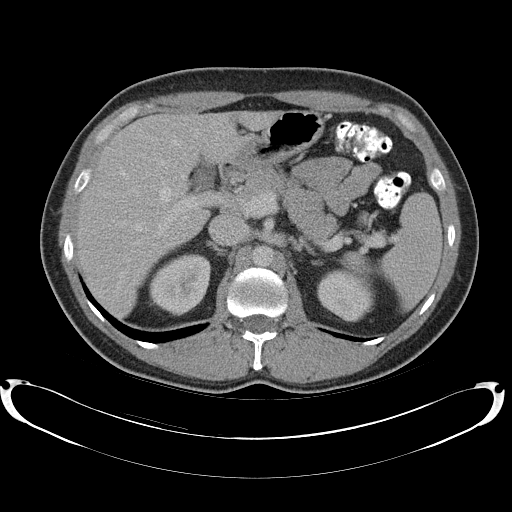
[im 76/90  soft-tissue]
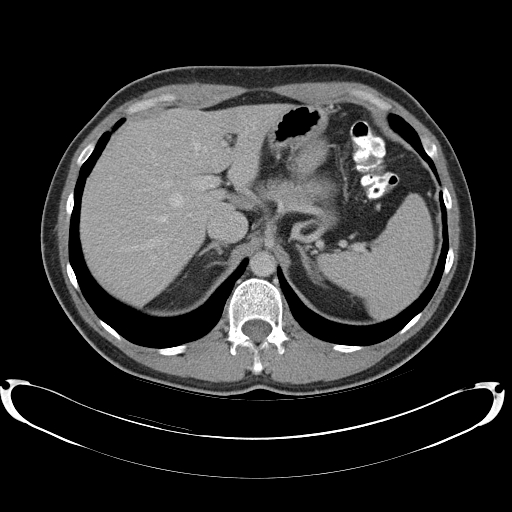
[im 85/90  soft-tissue]
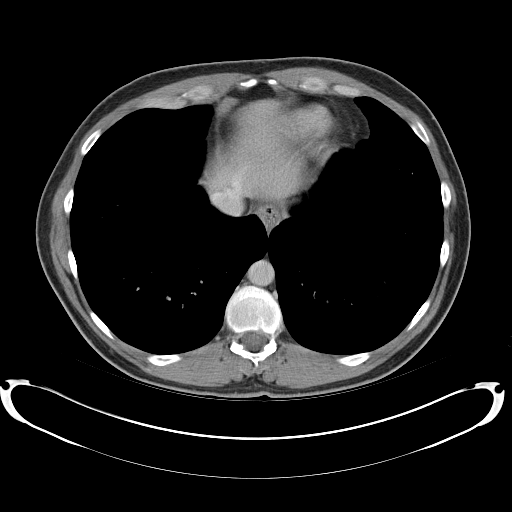

[Series 4: abd_pel_with 3.0 spo cor · coronal · 0.76mm/px · 3 of 78 slices shown]
[im 26/78  soft-tissue]
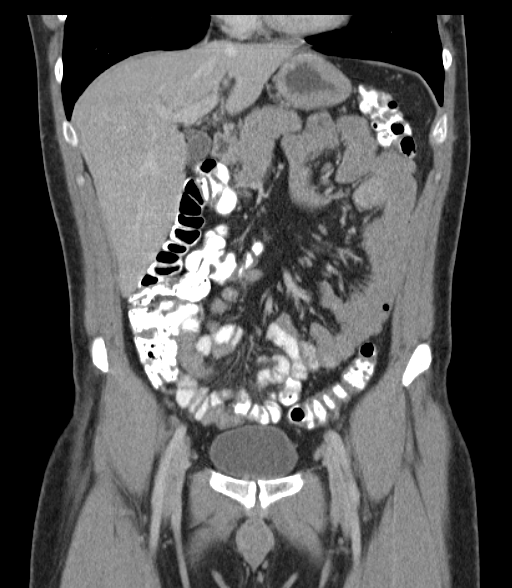
[im 35/78  soft-tissue]
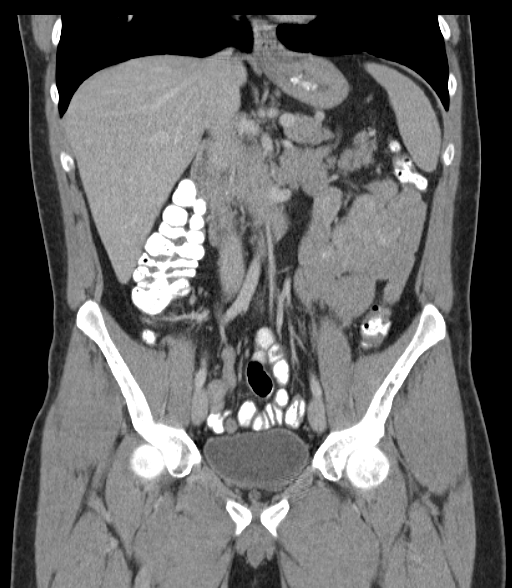
[im 43/78  soft-tissue]
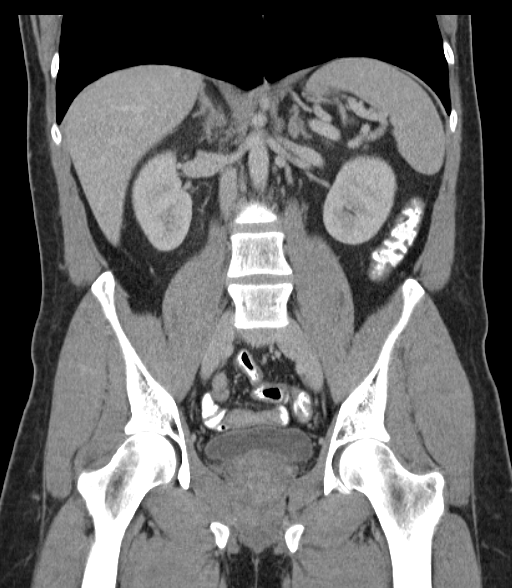

[16 of 46 positions shown; findings below may reference images not displayed]

FINDINGS: Lower chest:  Visualized lung bases are clear.

Hepatobiliary: Liver is mildly prominent measuring 17.9 cm in
length. No focal liver lesions are identified. There is no
gallbladder wall thickening. There is no appreciable biliary duct
dilatation.

Pancreas: No pancreatic mass or inflammatory focus appreciable.

Spleen: No splenic lesions are identified.

Adrenals/Urinary Tract: Adrenals appear normal bilaterally. Kidneys
bilaterally show no evidence of mass or hydronephrosis on either
side. No renal or ureteral calculi are identified on either side.
Urinary bladder is midline with wall thickness within normal limits.

Stomach/Bowel: There is slight wall thickening in the rectum and
distal sigmoid colon without surrounding mesenteric inflammation. No
fistulae. No other bowel wall thickening is seen. No mesenteric
thickening is seen. No bowel obstruction. No free air or portal
venous air.

Vascular/Lymphatic: There is no abdominal aortic aneurysm.
Mesenteric vessels appear patent. No vascular lesions appreciable.
No adenopathy is appreciable in the abdomen or pelvis.

Reproductive: Prostate and seminal vesicles appear unremarkable. No
pelvic mass or pelvic fluid collection.

Other: There is no periappendiceal region inflammation. Appendix
appears unremarkable. No abscess or ascites in the abdomen or
pelvis.

Musculoskeletal: No blastic or lytic bone lesions. No intramuscular
or abdominal wall lesions.
IMPRESSION: Slight wall thickening in the rectum and distal sigmoid colon,
possibly residua of prior colitis. No surrounding mesenteric
thickening. No other bowel wall thickening. No bowel obstruction.

Periappendiceal region appears normal. No abscess. No renal or
ureteral calculi. No hydronephrosis. Liver prominent without focal
lesion.

## 2016-02-11 ENCOUNTER — Ambulatory Visit (INDEPENDENT_AMBULATORY_CARE_PROVIDER_SITE_OTHER): Payer: BC Managed Care – PPO | Admitting: Nurse Practitioner

## 2016-02-11 ENCOUNTER — Encounter: Payer: Self-pay | Admitting: Nurse Practitioner

## 2016-02-11 VITALS — BP 130/78 | Temp 98.2°F | Ht 73.0 in | Wt 195.2 lb

## 2016-02-11 DIAGNOSIS — J329 Chronic sinusitis, unspecified: Secondary | ICD-10-CM

## 2016-02-11 DIAGNOSIS — J310 Chronic rhinitis: Secondary | ICD-10-CM

## 2016-02-11 MED ORDER — AZITHROMYCIN 250 MG PO TABS: ORAL_TABLET | ORAL | Status: DC

## 2016-02-11 NOTE — Progress Notes (Signed)
Subjective:  Presents for c/o burning and pain in the sinus area x 3 d. No fever. Sore throat in the am. Clear runny nose. Occasional cough; slight color to sputum. No wheezing. No ear pain. Body aches.   Objective:   BP 130/78 mmHg  Temp(Src) 98.2 F (36.8 C) (Oral)  Ht 6\' 1"  (1.854 m)  Wt 195 lb 3.2 oz (88.542 kg)  BMI 25.76 kg/m2 NAD. Alert, oriented. TMs clear effusion. Pharynx injected with PND noted. Neck supple with mild anterior adenopathy. Lungs clear. Heart RRR.   Assessment: Rhinosinusitis  Plan:  Meds ordered this encounter  Medications  . azithromycin (ZITHROMAX Z-PAK) 250 MG tablet    Sig: Take 2 tablets (500 mg) on  Day 1,  followed by 1 tablet (250 mg) once daily on Days 2 through 5.    Dispense:  6 each    Refill:  0    Order Specific Question:  Supervising Provider    Answer:  Merlyn AlbertLUKING, WILLIAM S [[id number]]   Saline nasal spray. OTC meds as directed for cough and congestion. Call back in 4-5 days if no improvement, sooner if worse.

## 2016-06-28 ENCOUNTER — Emergency Department (HOSPITAL_COMMUNITY): Payer: BC Managed Care – PPO

## 2016-06-28 ENCOUNTER — Encounter (HOSPITAL_COMMUNITY): Payer: Self-pay | Admitting: Emergency Medicine

## 2016-06-28 ENCOUNTER — Emergency Department (HOSPITAL_COMMUNITY)
Admission: EM | Admit: 2016-06-28 | Discharge: 2016-06-28 | Disposition: A | Discharge status: Home / Self Care | Payer: BC Managed Care – PPO | Attending: Emergency Medicine | Admitting: Emergency Medicine

## 2016-06-28 DIAGNOSIS — R079 Chest pain, unspecified: Secondary | ICD-10-CM | POA: Insufficient documentation

## 2016-06-28 DIAGNOSIS — Z79899 Other long term (current) drug therapy: Secondary | ICD-10-CM | POA: Diagnosis not present

## 2016-06-28 DIAGNOSIS — Z87891 Personal history of nicotine dependence: Secondary | ICD-10-CM | POA: Insufficient documentation

## 2016-06-28 LAB — CBC
HCT: 44.9 % (ref 39.0–52.0)
Hemoglobin: 15.1 g/dL (ref 13.0–17.0)
MCH: 27.8 pg (ref 26.0–34.0)
MCHC: 33.6 g/dL (ref 30.0–36.0)
MCV: 82.7 fL (ref 78.0–100.0)
PLATELETS: 242 10*3/uL (ref 150–400)
RBC: 5.43 MIL/uL (ref 4.22–5.81)
RDW: 12.9 % (ref 11.5–15.5)
WBC: 7.4 10*3/uL (ref 4.0–10.5)

## 2016-06-28 LAB — BASIC METABOLIC PANEL
Anion gap: 6 (ref 5–15)
BUN: 10 mg/dL (ref 6–20)
CALCIUM: 9.1 mg/dL (ref 8.9–10.3)
CO2: 28 mmol/L (ref 22–32)
CREATININE: 1.01 mg/dL (ref 0.61–1.24)
Chloride: 104 mmol/L (ref 101–111)
GFR calc Af Amer: >60 mL/min (ref >60)
Glucose, Bld: 98 mg/dL (ref 65–99)
Potassium: 3.5 mmol/L (ref 3.5–5.1)
SODIUM: 138 mmol/L (ref 135–145)

## 2016-06-28 LAB — I-STAT TROPONIN, ED
TROPONIN I, POC: 0 ng/mL (ref 0.00–0.08)
Troponin i, poc: 0.01 ng/mL (ref 0.00–0.08)

## 2016-06-28 NOTE — ED Provider Notes (Signed)
[facility]-EMERGENCY DEPT Provider Note   CSN: [id number] Arrival date & time: 06/28/16  1847     History   Chief Complaint Chief Complaint  Patient presents with  . Chest Pain    HPI Tony Gonzales is a 45 y.o. male.  He presents for evaluation of chest discomfort which occurred one hour after eating this evening. The meal was normal for him. The pain was felt in the center of his chest and radiated to his back, "beneath the shoulder blades". The pain was severe and worse than any pain he has had previously. The pain lasted about one hour, and resolved after he arrived here. He took 2  Art gallery manager"Gas Pills", while he was having the pain. He has had similar pain in the same area, but not this bad. He also relates having "indigestion" for 3 days. He has a "hiatal hernia" but does not take acid reducing medications. He has not smoked cigarettes. He worked a normal shift today. He is a Games developerdiesel mechanic. He does not smoke cigarettes. Family history for cardiac problems in father at age 43. There are no other no modifying factors.  HPI  Past Medical History:  Diagnosis Date  . C. difficile diarrhea 2010  . Colon polyps   . Diverticulitis   . GERD (gastroesophageal reflux disease)   . Hiatal hernia   . Seasonal allergies     Patient Active Problem List   Diagnosis Date Noted  . Diverticulitis of colon 06/24/2015  . Diarrhea 06/24/2015  . Diverticulosis 10/13/2013  . PSVT 10/28/2010  . PALPITATIONS 09/29/2010    History reviewed. No pertinent surgical history.     Home Medications    Prior to Admission medications   Medication Sig Start Date End Date Taking? Authorizing Provider  Probiotic Product (PROBIOTIC FORMULA PO) Take 1 capsule by mouth daily.   Yes Historical Provider, MD  Simethicone (GAS-X PO) Take 2 tablets by mouth once as needed (for relief).   Yes Historical Provider, MD  vitamin C (ASCORBIC ACID) 500 MG tablet Take 500 mg by mouth daily.   Yes Historical Provider, MD     Family History Family History  Problem Relation Age of Onset  . Bladder Cancer Father   . Coronary artery disease Father     In his 950's  . Colon cancer Neg Hx     Social History Social History  Substance Use Topics  . Smoking status: Former Smoker    Types: Cigarettes    Quit date: 06/24/1995  . Smokeless tobacco: Former NeurosurgeonUser    Types: Chew    Quit date: 06/24/1995  . Alcohol use No     Allergies   Patient has no known allergies.   Review of Systems Review of Systems  All other systems reviewed and are negative.    Physical Exam Updated Vital Signs BP 110/82   Pulse 77   Temp 98.1 F (36.7 C)   Resp 20   Ht 6\' 1"  (1.854 m)   Wt 205 lb (93 kg)   SpO2 99%   BMI 27.05 kg/m   Physical Exam  Constitutional: He is oriented to person, place, and time. He appears well-developed and well-nourished. No distress.  HENT:  Head: Normocephalic and atraumatic.  Right Ear: External ear normal.  Left Ear: External ear normal.  Eyes: Conjunctivae and EOM are normal. Pupils are equal, round, and reactive to light.  Neck: Normal range of motion and phonation normal. Neck supple.  Cardiovascular: Normal rate, regular rhythm and  normal heart sounds.   Pulmonary/Chest: Effort normal and breath sounds normal. No respiratory distress. He exhibits no tenderness and no bony tenderness.  Abdominal: Soft. There is no tenderness.  Musculoskeletal: Normal range of motion.  Neurological: He is alert and oriented to person, place, and time. No cranial nerve deficit or sensory deficit. He exhibits normal muscle tone. Coordination normal.  Skin: Skin is warm, dry and intact.  Psychiatric: He has a normal mood and affect. His behavior is normal. Judgment and thought content normal.  Nursing note and vitals reviewed.    ED Treatments / Results  Labs (all labs ordered are listed, but only abnormal results are displayed) Labs Reviewed  BASIC METABOLIC PANEL  CBC  I-STAT TROPOININ,  ED  I-STAT TROPOININ, ED    EKG  EKG Interpretation  Date/Time:  Tuesday June 28 2016 18:59:22 EST Ventricular Rate:  81 PR Interval:    QRS Duration: 101 QT Interval:  352 QTC Calculation: 409 R Axis:   91 Text Interpretation:  Sinus rhythm Borderline right axis deviation No old tracing to compare Confirmed by [facility] CenterWENTZ  MD, Fiona Coto [phone]([id number]) on 06/28/2016 7:29:06 PM       Radiology Dg Chest 2 View  Result Date: 06/28/2016 CLINICAL DATA:  Chest pain for 1 hour EXAM: CHEST  2 VIEW COMPARISON:  None. FINDINGS: The heart size and mediastinal contours are within normal limits. Both lungs are clear. The visualized skeletal structures are unremarkable. IMPRESSION: No active cardiopulmonary disease. Electronically Signed   By: Alcide CleverMark  Lukens M.D.   On: 06/28/2016 19:57    Procedures Procedures (including critical care time)  Medications Ordered in ED Medications - No data to display   Initial Impression / Assessment and Plan / ED Course  I have reviewed the triage vital signs and the nursing notes.  Pertinent labs & imaging results that were available during my care of the patient were reviewed by me and considered in my medical decision making (see chart for details).  Clinical Course as of Jun 29 2247  Tue Jun 28, 2016  1951 Pain, atypical for cardiac disease, with normal EKG. Will check initial and follow-up delta troponin at 3 hours, and anticipate discharge with outpatient follow-up.  [EW]    Clinical Course User Index [EW] Mancel BaleElliott Lilyian Quayle, MD    Medications - No data to display  Patient Vitals for the past 24 hrs:  BP Temp Pulse Resp SpO2 Height Weight  06/28/16 2230 110/82 - 77 20 99 % - -  06/28/16 2200 120/84 - 85 18 98 % - -  06/28/16 2130 121/76 - 68 14 98 % - -  06/28/16 1930 130/86 - 82 16 98 % - -  06/28/16 1900 138/93 - 87 17 100 % - -  06/28/16 1857 146/81 98.1 F (36.7 C) 88 21 99 % - -  06/28/16 1856 - - - - - 6\' 1"  (1.854 m) 205 lb (93 kg)    10:47 PM  Reevaluation with update and discussion. After initial assessment and treatment, an updated evaluation reveals He remains comfortable and has not had anymore chest pain.Mancel Bale. Shalena Ezzell L    Final Clinical Impressions(s) / ED Diagnoses   Final diagnoses:  Nonspecific chest pain   Nonspecific chest pain with reassuring evaluation. Doubt ACS, PE or pneumonia. Low risk for cardiac problems, and stable for discharge and outpatient follow-up.  Nursing Notes Reviewed/ Care Coordinated Applicable Imaging Reviewed Interpretation of Laboratory Data incorporated into ED treatment  The patient appears reasonably screened and/or  stabilized for discharge and I doubt any other medical condition or other [facility] requiring further screening, evaluation, or treatment in the ED at this time prior to discharge.  Plan: Home Medications- continue, also use Nexium or Pepcid for 1 month; Home Treatments- rest; return here if the recommended treatment, does not improve the symptoms; Recommended follow up- Cardiology 1 week for check up and possible stress test   New Prescriptions New Prescriptions   No medications on file     Mancel Bale, MD 06/28/16 2251

## 2016-06-28 NOTE — Discharge Instructions (Signed)
Start taking Nexium or Pepcid, for possible acid related pain.  Call a cardiologist for a follow-up appointment to be evaluated further, possibly with a stress test.  Return here, if needed, for problems.

## 2016-06-28 NOTE — ED Triage Notes (Signed)
Pt c/o chest pain that radiates through to back for about one hour.

## 2016-12-02 IMAGING — DX DG CHEST 2V
2 series · 2 of 2 positions shown · non-contrast
Comparison: None.

CLINICAL DATA: Chest pain for 1 hour

EXAM:
CHEST  2 VIEW

[chest pa]
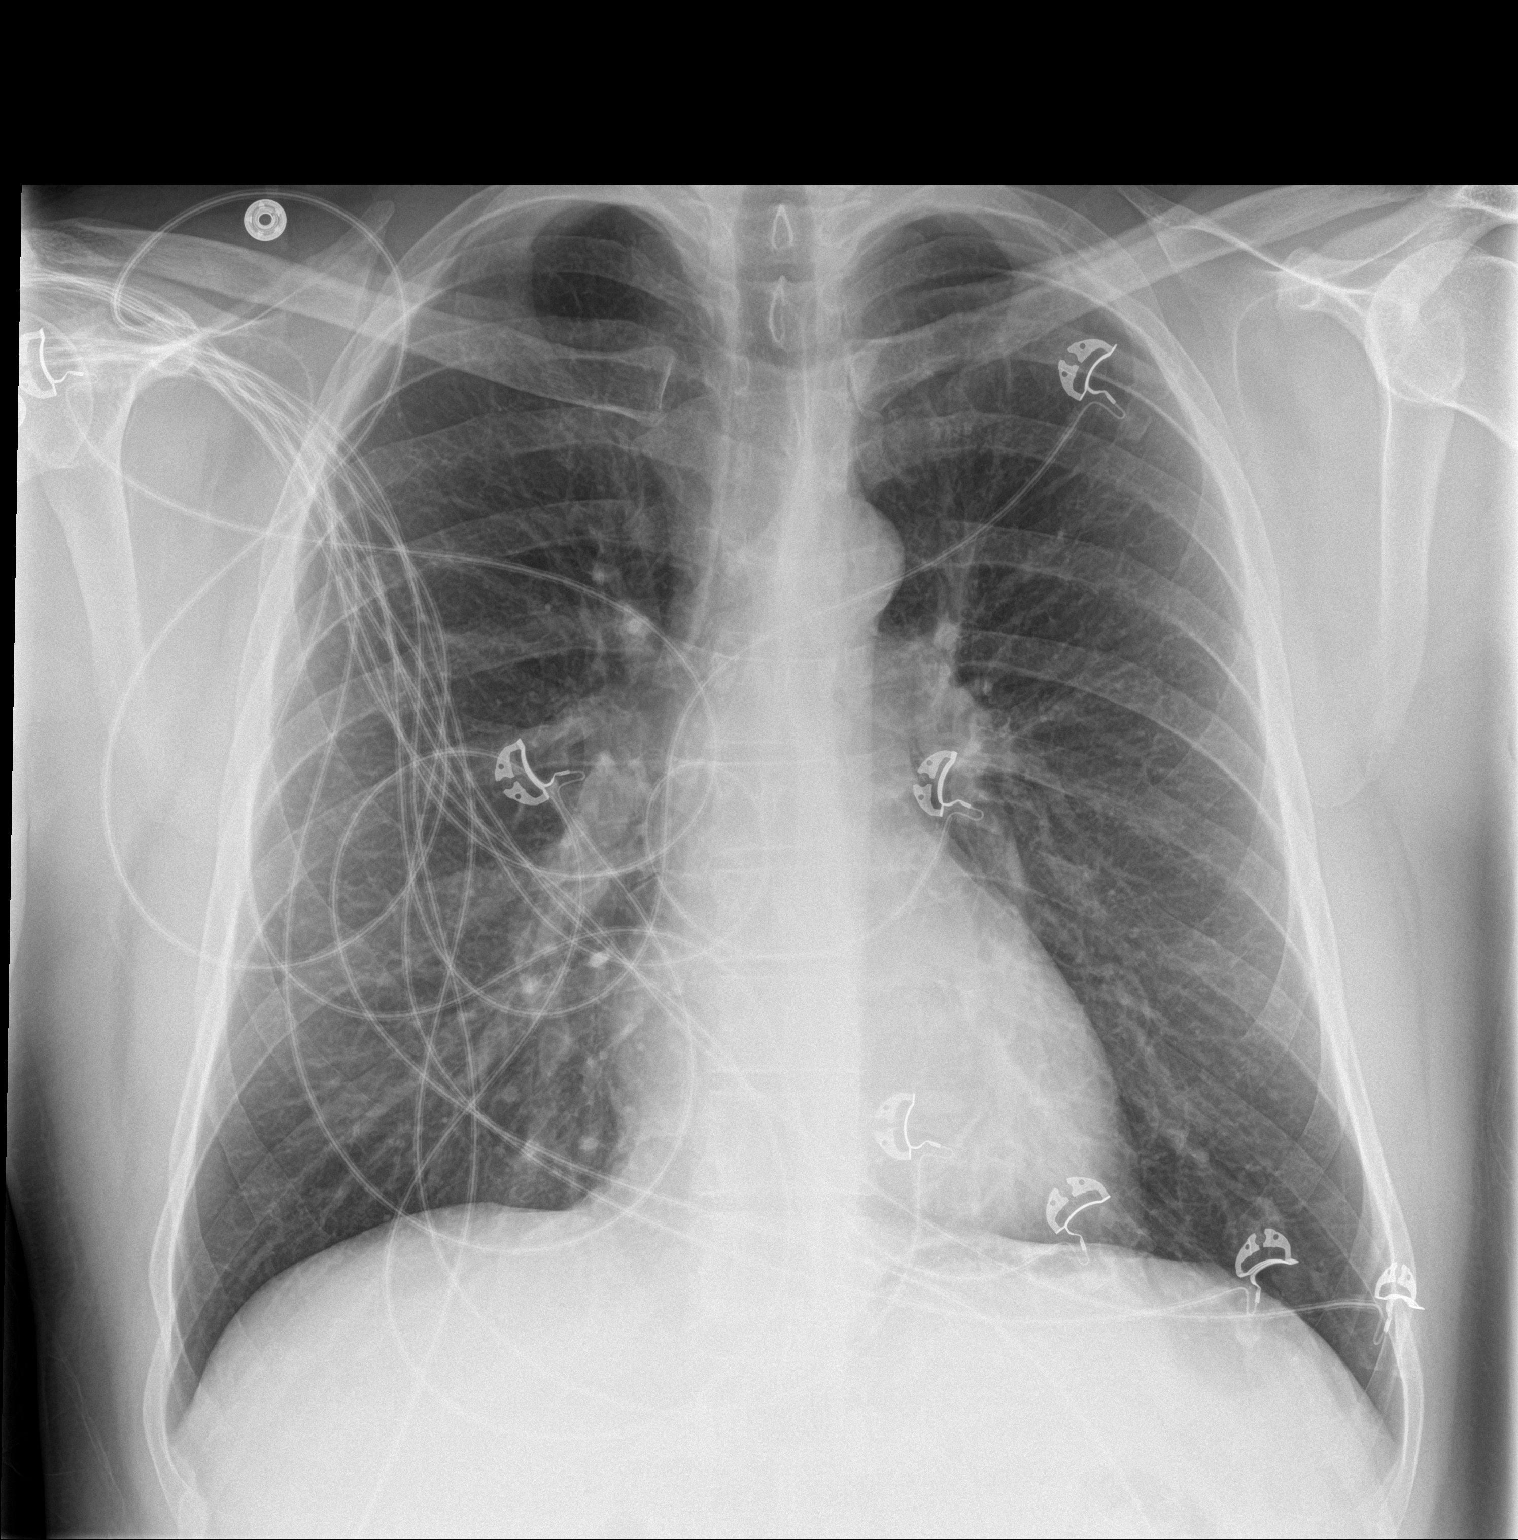

[chest lat]
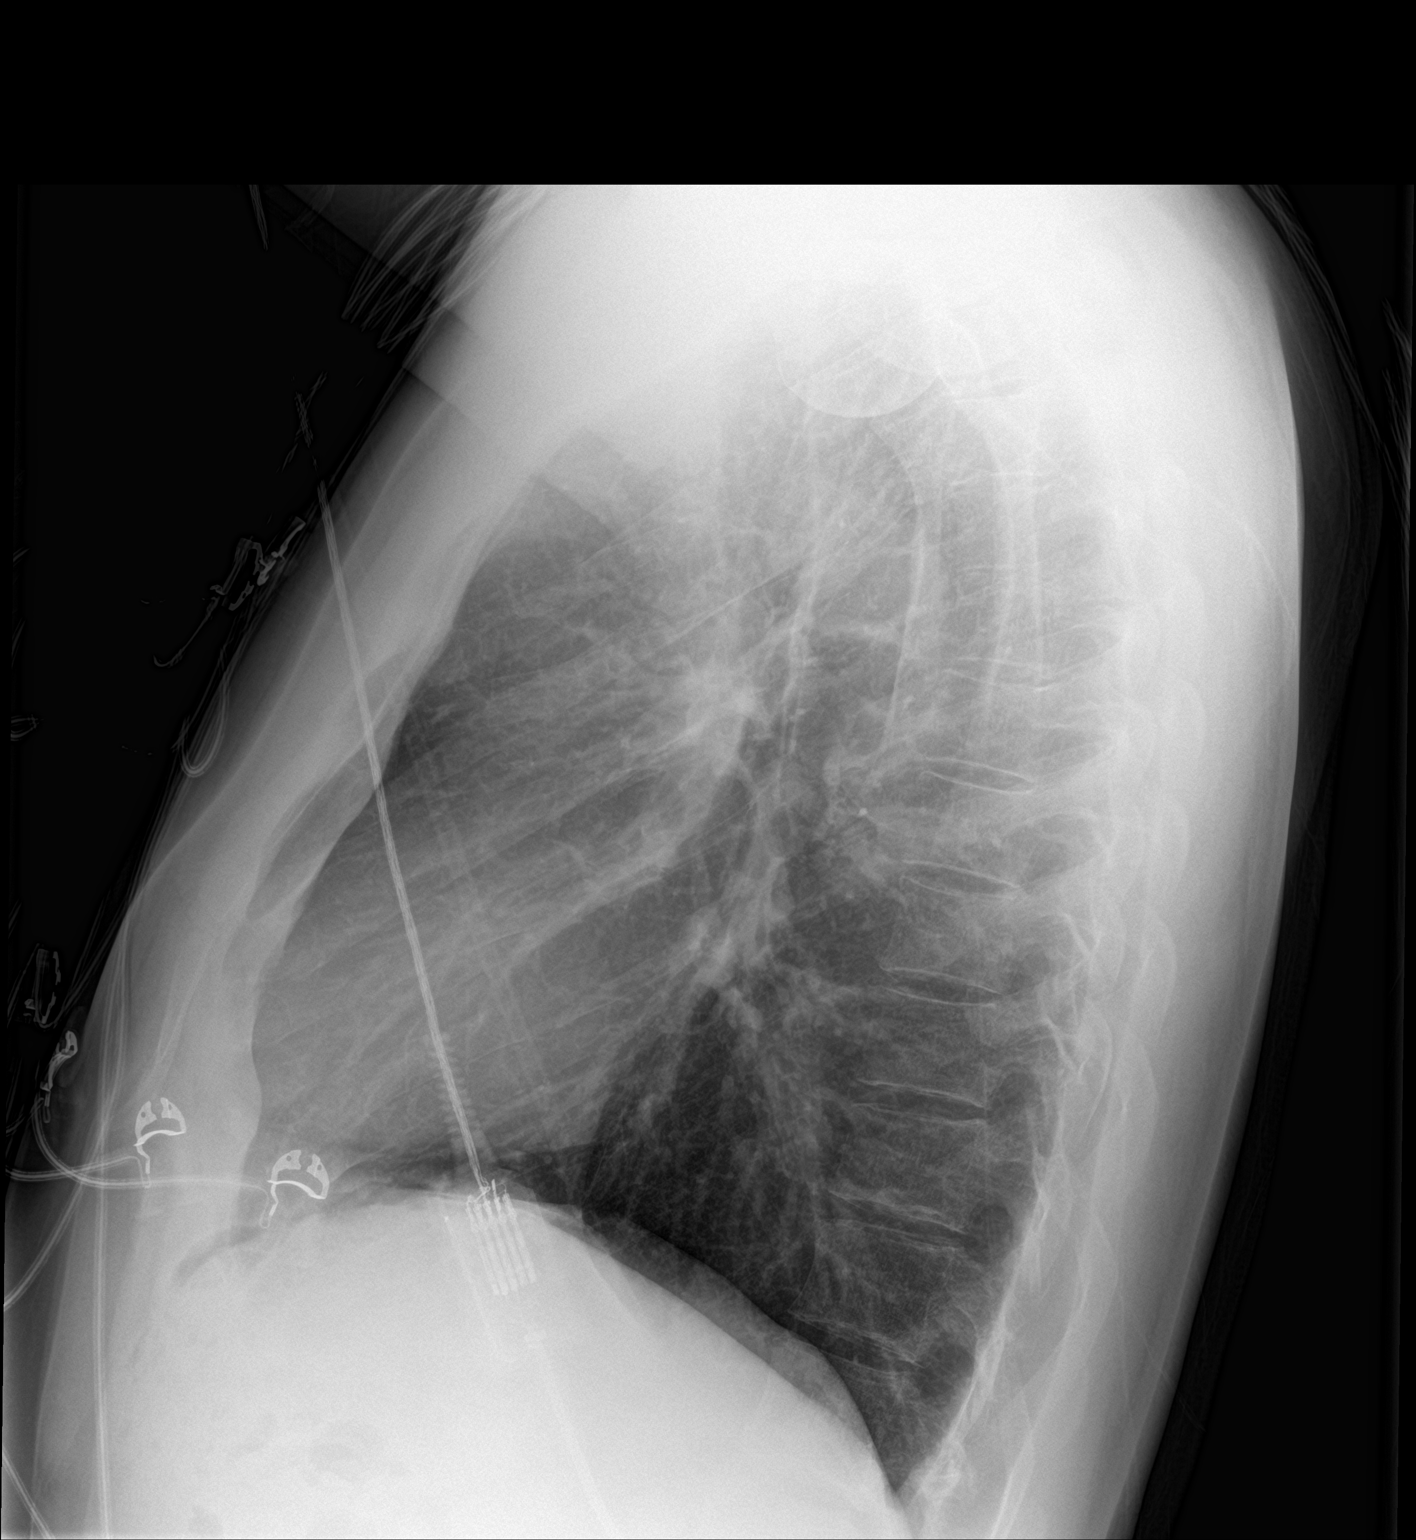

[2 of 2 positions shown; findings below may reference images not displayed]

FINDINGS: The heart size and mediastinal contours are within normal limits.
Both lungs are clear. The visualized skeletal structures are
unremarkable.
IMPRESSION: No active cardiopulmonary disease.

## 2017-10-17 ENCOUNTER — Encounter: Payer: Self-pay | Admitting: Gastroenterology

## 2019-03-25 ENCOUNTER — Other Ambulatory Visit: Payer: BC Managed Care – PPO

## 2019-03-25 ENCOUNTER — Other Ambulatory Visit: Payer: Self-pay

## 2019-03-25 DIAGNOSIS — Z20822 Contact with and (suspected) exposure to covid-19: Secondary | ICD-10-CM

## 2019-03-25 NOTE — Progress Notes (Signed)
Lab orders

## 2019-03-26 ENCOUNTER — Encounter: Payer: Self-pay | Admitting: Family Medicine

## 2019-03-26 LAB — NOVEL CORONAVIRUS, NAA: SARS-CoV-2, NAA: NOT DETECTED

## 2019-09-11 ENCOUNTER — Ambulatory Visit: Payer: BC Managed Care – PPO | Attending: Internal Medicine

## 2019-09-11 ENCOUNTER — Other Ambulatory Visit: Payer: Self-pay

## 2019-09-11 DIAGNOSIS — Z20822 Contact with and (suspected) exposure to covid-19: Secondary | ICD-10-CM

## 2019-09-12 ENCOUNTER — Ambulatory Visit: Payer: Self-pay

## 2019-09-12 ENCOUNTER — Telehealth: Payer: Self-pay | Admitting: *Deleted

## 2019-09-12 LAB — NOVEL CORONAVIRUS, NAA: SARS-CoV-2, NAA: NOT DETECTED

## 2019-09-12 NOTE — Telephone Encounter (Signed)
He called in requesting COVID-19 test result.    I let him know it has not been processed yet. I let him know it would show up on his MyChart once it is processed when he mentioned,   "I've been checking MyChart".  He thanked me for my help.

## 2019-09-12 NOTE — Telephone Encounter (Signed)
Patient calling for covid test results.  Not available yet.  Patient states they will call bak

## 2019-09-25 ENCOUNTER — Encounter: Payer: Self-pay | Admitting: Family Medicine

## 2020-06-20 ENCOUNTER — Telehealth: Payer: Self-pay | Admitting: Unknown Physician Specialty

## 2020-06-20 NOTE — Telephone Encounter (Signed)
I connected by phone with Tony Gonzales on 06/20/2020 at 5:41 PM to discuss the potential use of a new treatment for mild to moderate COVID-19 viral infection in non-hospitalized patients.  This patient is a 50 y.o. male that meets the FDA criteria for Emergency Use Authorization of COVID monoclonal antibody casirivimab/imdevimab or bamlanivimab/eteseviamb.  Has a (+) direct SARS-CoV-2 viral test result  Has mild or moderate COVID-19   Is NOT hospitalized due to COVID-19  Is within 10 days of symptom onset  Has at least one of the high risk factor(s) for progression to severe COVID-19 and/or hospitalization as defined in EUA.  Specific high risk criteria : BMI > 25   I have spoken and communicated the following to the patient or parent/caregiver regarding COVID monoclonal antibody treatment:  1. FDA has authorized the emergency use for the treatment of mild to moderate COVID-19 in adults and pediatric patients with positive results of direct SARS-CoV-2 viral testing who are 52 years of age and older weighing at least 40 kg, and who are at high risk for progressing to severe COVID-19 and/or hospitalization.  2. The significant known and potential risks and benefits of COVID monoclonal antibody, and the extent to which such potential risks and benefits are unknown.  3. Information on available alternative treatments and the risks and benefits of those alternatives, including clinical trials.  4. Patients treated with COVID monoclonal antibody should continue to self-isolate and use infection control measures (e.g., wear mask, isolate, social distance, avoid sharing personal items, clean and disinfect "high touch" surfaces, and frequent handwashing) according to CDC guidelines.   5. The patient or parent/caregiver has the option to accept or refuse COVID monoclonal antibody treatment.  After reviewing this information with the patient pt would like to think about.Tony Cirri,  NP 06/20/2020 5:41 PM Sx onset 10/28

## 2020-06-21 ENCOUNTER — Other Ambulatory Visit (HOSPITAL_COMMUNITY): Payer: Self-pay | Admitting: Family

## 2020-06-21 DIAGNOSIS — U071 COVID-19: Secondary | ICD-10-CM

## 2020-06-21 NOTE — Progress Notes (Signed)
I connected by phone with Tony Gonzales on 06/21/2020 at 4:56 PM to discuss the potential use of a new treatment for mild to moderate COVID-19 viral infection in non-hospitalized patients.  This patient is a 49 y.o. male that meets the FDA criteria for Emergency Use Authorization of COVID monoclonal antibody casirivimab/imdevimab or bamlanivimab/eteseviamb.  Has a (+) direct SARS-CoV-2 viral test result  Has mild or moderate COVID-19   Is NOT hospitalized due to COVID-19  Is within 10 days of symptom onset  Has at least one of the high risk factor(s) for progression to severe COVID-19 and/or hospitalization as defined in EUA.  Specific high risk criteria : BMI > 25   Symptoms of fever, aches, cough began 06/18/20.   I have spoken and communicated the following to the patient or parent/caregiver regarding COVID monoclonal antibody treatment:  1. FDA has authorized the emergency use for the treatment of mild to moderate COVID-19 in adults and pediatric patients with positive results of direct SARS-CoV-2 viral testing who are 74 years of age and older weighing at least 40 kg, and who are at high risk for progressing to severe COVID-19 and/or hospitalization.  2. The significant known and potential risks and benefits of COVID monoclonal antibody, and the extent to which such potential risks and benefits are unknown.  3. Information on available alternative treatments and the risks and benefits of those alternatives, including clinical trials.  4. Patients treated with COVID monoclonal antibody should continue to self-isolate and use infection control measures (e.g., wear mask, isolate, social distance, avoid sharing personal items, clean and disinfect "high touch" surfaces, and frequent handwashing) according to CDC guidelines.   5. The patient or parent/caregiver has the option to accept or refuse COVID monoclonal antibody treatment.  After reviewing this information with the  patient, the patient has agreed to receive one of the available covid 19 monoclonal antibodies and will be provided an appropriate fact sheet prior to infusion. [name], NP 06/21/2020 4:56 PM

## 2020-06-22 ENCOUNTER — Ambulatory Visit (HOSPITAL_COMMUNITY)
Admission: RE | Admit: 2020-06-22 | Discharge: 2020-06-22 | Disposition: A | Discharge status: Home / Self Care | Payer: BC Managed Care – PPO | Source: Ambulatory Visit | Attending: Pulmonary Disease | Admitting: Pulmonary Disease

## 2020-06-22 DIAGNOSIS — U071 COVID-19: Secondary | ICD-10-CM | POA: Diagnosis not present

## 2020-06-22 MED ORDER — METHYLPREDNISOLONE SODIUM SUCC 125 MG IJ SOLR: 125.0000 mg | Freq: Once | INTRAMUSCULAR | Status: DC | PRN

## 2020-06-22 MED ORDER — SODIUM CHLORIDE 0.9 % IV SOLN: INTRAVENOUS | Status: DC | PRN

## 2020-06-22 MED ORDER — SOTROVIMAB 500 MG/8ML IV SOLN
500.0000 mg | Freq: Once | INTRAVENOUS | Status: AC
  Administered 2020-06-22: 500 mg via INTRAVENOUS

## 2020-06-22 MED ORDER — EPINEPHRINE 0.3 MG/0.3ML IJ SOAJ: 0.3000 mg | Freq: Once | INTRAMUSCULAR | Status: DC | PRN

## 2020-06-22 MED ORDER — ALBUTEROL SULFATE HFA 108 (90 BASE) MCG/ACT IN AERS: 2.0000 | INHALATION_SPRAY | Freq: Once | RESPIRATORY_TRACT | Status: DC | PRN

## 2020-06-22 MED ORDER — DIPHENHYDRAMINE HCL 50 MG/ML IJ SOLN: 50.0000 mg | Freq: Once | INTRAMUSCULAR | Status: DC | PRN

## 2020-06-22 MED ORDER — SODIUM CHLORIDE 0.9 % IV SOLN: Freq: Once | INTRAVENOUS | Status: DC

## 2020-06-22 MED ORDER — FAMOTIDINE IN NACL 20-0.9 MG/50ML-% IV SOLN: 20.0000 mg | Freq: Once | INTRAVENOUS | Status: DC | PRN

## 2020-06-22 NOTE — Discharge Instructions (Signed)

## 2020-06-22 NOTE — Progress Notes (Signed)
  Diagnosis: COVID-19  Physician: Patrick Wright, MD  Procedure: Sotrovimab Infusion   Complications: No immediate complications noted.  Discharge: Discharged home   Latiya Navia N Terrian Sentell 06/22/2020  

## 2020-06-26 ENCOUNTER — Ambulatory Visit
Admission: RE | Admit: 2020-06-26 | Discharge: 2020-06-26 | Disposition: A | Discharge status: Home / Self Care | Payer: BC Managed Care – PPO | Source: Ambulatory Visit | Attending: Emergency Medicine | Admitting: Emergency Medicine

## 2020-06-26 ENCOUNTER — Other Ambulatory Visit: Payer: Self-pay

## 2020-06-26 VITALS — BP 135/94 | HR 97 | Temp 98.8°F | Resp 16 | Ht 73.0 in | Wt 218.0 lb

## 2020-06-26 DIAGNOSIS — U071 COVID-19: Secondary | ICD-10-CM | POA: Diagnosis not present

## 2020-06-26 DIAGNOSIS — J0100 Acute maxillary sinusitis, unspecified: Secondary | ICD-10-CM

## 2020-06-26 MED ORDER — BENZONATATE 100 MG PO CAPS: 100.0000 mg | ORAL_CAPSULE | Freq: Three times a day (TID) | ORAL | 0 refills | Status: DC

## 2020-06-26 MED ORDER — AMOXICILLIN-POT CLAVULANATE 875-125 MG PO TABS: 1.0000 | ORAL_TABLET | Freq: Two times a day (BID) | ORAL | 0 refills | Status: DC

## 2020-06-26 MED ORDER — DEXAMETHASONE 4 MG PO TABS: 4.0000 mg | ORAL_TABLET | Freq: Every day | ORAL | 0 refills | Status: AC

## 2020-06-26 NOTE — Discharge Instructions (Addendum)
You should remain isolated in your home for 10 days from symptom onset AND greater than 24 hours after symptoms resolution (absence of fever without the use of fever-reducing medication and improvement in respiratory symptoms), whichever is longer Get plenty of rest and push fluids Tessalon Perles prescribed for cough Augmentin prescribed for acute sinusitis Decadron was prescribed Use medications daily for symptom relief Use OTC medications like ibuprofen or tylenol as needed fever or pain Call or go to the ED if you have any new or worsening symptoms such as fever, worsening cough, shortness of breath, chest tightness, chest pain, turning blue, changes in mental status, etc..

## 2020-06-26 NOTE — ED Provider Notes (Signed)
[facility]   [id number] 06/26/20 Arrival Time: 1038  Chief Complaint  Patient presents with  . Nasal Congestion    covid +  . Cough     SUBJECTIVE: History from: patient and family.  Tony Gonzales is a 49 y.o. male who presented to the urgent care with a complaint of Covid infection time 7 days, sinus pressure, sinus pain, cough and congestion that has developed after getting Covid infusion.  Denies sick exposure to COVID, flu or strep.  Denies recent travel.  Has tried OTC medication without relief.  Denies alleviating factors.  Denies previous symptoms in the past.   Denies fever, chills, fatigue, sinus pain, rhinorrhea, sore throat, SOB, wheezing, chest pain, nausea, changes in bowel or bladder habits.    ROS: As per HPI.  All other pertinent ROS negative.     Past Medical History:  Diagnosis Date  . C. difficile diarrhea 2010  . Colon polyps   . Diverticulitis   . GERD (gastroesophageal reflux disease)   . Hiatal hernia   . Seasonal allergies    No past surgical history on file. No Known Allergies No current facility-administered medications on file prior to encounter.   Current Outpatient Medications on File Prior to Encounter  Medication Sig Dispense Refill  . Probiotic Product (PROBIOTIC FORMULA PO) Take 1 capsule by mouth daily.    . Simethicone (GAS-X PO) Take 2 tablets by mouth once as needed (for relief).    . vitamin C (ASCORBIC ACID) 500 MG tablet Take 500 mg by mouth daily.     Social History   Socioeconomic History  . Marital status: Married    Spouse name: Not on file  . Number of children: 2  . Years of education: Not on file  . Highest education level: Not on file  Occupational History  . Occupation: Games developer  Tobacco Use  . Smoking status: Former Smoker    Types: Cigarettes    Quit date: 06/24/1995    Years since quitting: 25.0  . Smokeless tobacco: Former Neurosurgeon    Types: Chew    Quit date: 06/24/1995  Substance and  Sexual Activity  . Alcohol use: No    Alcohol/week: 0.0 standard drinks  . Drug use: No  . Sexual activity: Not on file  Other Topics Concern  . Not on file  Social History Narrative   Wife is a Engineer, civil (consulting)   No regular exercise   Social Determinants of Health   Financial Resource Strain:   . Difficulty of Paying Living Expenses: Not on file  Food Insecurity:   . Worried About Programme researcher, broadcasting/film/video in the Last Year: Not on file  . Ran Out of Food in the Last Year: Not on file  Transportation Needs:   . Lack of Transportation (Medical): Not on file  . Lack of Transportation (Non-Medical): Not on file  Physical Activity:   . Days of Exercise per Week: Not on file  . Minutes of Exercise per Session: Not on file  Stress:   . Feeling of Stress : Not on file  Social Connections:   . Frequency of Communication with Friends and Family: Not on file  . Frequency of Social Gatherings with Friends and Family: Not on file  . Attends Religious Services: Not on file  . Active Member of Clubs or Organizations: Not on file  . Attends Banker Meetings: Not on file  . Marital Status: Not on file  Intimate Partner Violence:   .  Fear of Current or Ex-Partner: Not on file  . Emotionally Abused: Not on file  . Physically Abused: Not on file  . Sexually Abused: Not on file   Family History  Problem Relation Age of Onset  . Bladder Cancer Father   . Coronary artery disease Father        In his 59's  . Colon cancer Neg Hx     OBJECTIVE:  Vitals:   06/26/20 1056 06/26/20 1057  BP: (!) 135/94   Pulse: 97   Resp: 16   Temp: 98.8 F (37.1 C)   TempSrc: Oral   SpO2: 99%   Weight:  218 lb (98.9 kg)  Height:  6\' 1"  (1.854 m)     General appearance: alert; appears fatigued, but nontoxic; speaking in full sentences and tolerating own secretions HEENT: NCAT; Ears: EACs clear, TMs pearly gray; Eyes: PERRL.  EOM grossly intact. Sinuses: nontender; Nose: nares patent without rhinorrhea,  Throat: oropharynx clear, tonsils non erythematous or enlarged, uvula midline  Neck: supple without LAD Lungs: unlabored respirations, symmetrical air entry; cough: moderate; no respiratory distress; CTAB Heart: regular rate and rhythm.  Radial pulses 2+ symmetrical bilaterally Skin: warm and dry Psychological: alert and cooperative; normal mood and affect  LABS:  No results found for this or any previous visit (from the past 24 hour(s)).   ASSESSMENT & PLAN:  1. COVID-19 virus infection   2. Acute non-recurrent maxillary sinusitis     Meds ordered this encounter  Medications  . benzonatate (TESSALON) 100 MG capsule    Sig: Take 1 capsule (100 mg total) by mouth every 8 (eight) hours.    Dispense:  30 capsule    Refill:  0  . amoxicillin-clavulanate (AUGMENTIN) 875-125 MG tablet    Sig: Take 1 tablet by mouth every 12 (twelve) hours.    Dispense:  14 tablet    Refill:  0  . dexamethasone (DECADRON) 4 MG tablet    Sig: Take 1 tablet (4 mg total) by mouth daily for 10 days.    Dispense:  10 tablet    Refill:  0    Discharge instructions  You should remain isolated in your home for 10 days from symptom onset AND greater than 24 hours after symptoms resolution (absence of fever without the use of fever-reducing medication and improvement in respiratory symptoms), whichever is longer Get plenty of rest and push fluids Tessalon Perles prescribed for cough Augmentin prescribed for acute sinusitis Decadron was prescribed Use medications daily for symptom relief Use OTC medications like ibuprofen or tylenol as needed fever or pain Call or go to the ED if you have any new or worsening symptoms such as fever, worsening cough, shortness of breath, chest tightness, chest pain, turning blue, changes in mental status, etc...   Reviewed expectations re: course of current medical issues. Questions answered. Outlined signs and symptoms indicating need for more acute intervention. Patient  verbalized understanding. After Visit Summary given.         , FNP 06/26/20 1123

## 2020-06-26 NOTE — ED Triage Notes (Signed)
Pt dx with covid on 06/19/20. Pt since then had gotten infusion on Monday he started feeling worse. Pt now having cough, congestion, with low grade fevers. Pt said at night he feels his worse with all the congestion settling in his face and head on one side.

## 2020-08-24 ENCOUNTER — Other Ambulatory Visit: Payer: Self-pay | Admitting: Otolaryngology

## 2020-09-25 ENCOUNTER — Other Ambulatory Visit: Payer: Self-pay

## 2020-09-25 ENCOUNTER — Encounter (HOSPITAL_BASED_OUTPATIENT_CLINIC_OR_DEPARTMENT_OTHER): Payer: Self-pay | Admitting: Otolaryngology

## 2020-09-29 ENCOUNTER — Other Ambulatory Visit (HOSPITAL_COMMUNITY)
Admission: RE | Admit: 2020-09-29 | Discharge: 2020-09-29 | Disposition: A | Discharge status: Home / Self Care | Payer: BC Managed Care – PPO | Source: Ambulatory Visit | Attending: Otolaryngology | Admitting: Otolaryngology

## 2020-09-29 DIAGNOSIS — Z87891 Personal history of nicotine dependence: Secondary | ICD-10-CM | POA: Diagnosis not present

## 2020-09-29 DIAGNOSIS — J342 Deviated nasal septum: Secondary | ICD-10-CM | POA: Diagnosis not present

## 2020-09-29 DIAGNOSIS — J310 Chronic rhinitis: Secondary | ICD-10-CM | POA: Diagnosis not present

## 2020-09-29 DIAGNOSIS — Z01812 Encounter for preprocedural laboratory examination: Secondary | ICD-10-CM | POA: Insufficient documentation

## 2020-09-29 DIAGNOSIS — Z20822 Contact with and (suspected) exposure to covid-19: Secondary | ICD-10-CM | POA: Insufficient documentation

## 2020-09-29 DIAGNOSIS — J343 Hypertrophy of nasal turbinates: Secondary | ICD-10-CM | POA: Diagnosis not present

## 2020-09-29 DIAGNOSIS — Z8616 Personal history of COVID-19: Secondary | ICD-10-CM | POA: Diagnosis not present

## 2020-09-29 LAB — SARS CORONAVIRUS 2 (TAT 6-24 HRS): SARS Coronavirus 2: NEGATIVE

## 2020-10-02 ENCOUNTER — Other Ambulatory Visit: Payer: Self-pay

## 2020-10-02 ENCOUNTER — Ambulatory Visit (HOSPITAL_BASED_OUTPATIENT_CLINIC_OR_DEPARTMENT_OTHER): Payer: BC Managed Care – PPO | Admitting: Anesthesiology

## 2020-10-02 ENCOUNTER — Ambulatory Visit (HOSPITAL_BASED_OUTPATIENT_CLINIC_OR_DEPARTMENT_OTHER)
Admission: RE | Admit: 2020-10-02 | Discharge: 2020-10-02 | Disposition: A | Discharge status: Home / Self Care | Payer: BC Managed Care – PPO | Attending: Otolaryngology | Admitting: Otolaryngology

## 2020-10-02 ENCOUNTER — Encounter (HOSPITAL_BASED_OUTPATIENT_CLINIC_OR_DEPARTMENT_OTHER): Payer: Self-pay | Admitting: Otolaryngology

## 2020-10-02 ENCOUNTER — Encounter (HOSPITAL_BASED_OUTPATIENT_CLINIC_OR_DEPARTMENT_OTHER)
Admission: RE | Disposition: A | Discharge status: Home / Self Care | Payer: Self-pay | Source: Home / Self Care | Attending: Otolaryngology

## 2020-10-02 DIAGNOSIS — Z20822 Contact with and (suspected) exposure to covid-19: Secondary | ICD-10-CM | POA: Insufficient documentation

## 2020-10-02 DIAGNOSIS — J342 Deviated nasal septum: Secondary | ICD-10-CM | POA: Insufficient documentation

## 2020-10-02 DIAGNOSIS — Z8616 Personal history of COVID-19: Secondary | ICD-10-CM | POA: Insufficient documentation

## 2020-10-02 DIAGNOSIS — Z87891 Personal history of nicotine dependence: Secondary | ICD-10-CM | POA: Insufficient documentation

## 2020-10-02 DIAGNOSIS — J310 Chronic rhinitis: Secondary | ICD-10-CM | POA: Insufficient documentation

## 2020-10-02 DIAGNOSIS — J343 Hypertrophy of nasal turbinates: Secondary | ICD-10-CM | POA: Insufficient documentation

## 2020-10-02 HISTORY — PX: NASAL SEPTOPLASTY W/ TURBINOPLASTY: SHX2070

## 2020-10-02 SURGERY — SEPTOPLASTY, NOSE, WITH NASAL TURBINATE REDUCTION
Anesthesia: General | Site: Nose

## 2020-10-02 MED ORDER — FENTANYL CITRATE (PF) 100 MCG/2ML IJ SOLN
INTRAMUSCULAR | Status: DC | PRN
  Administered 2020-10-02 (×2): 50 ug via INTRAVENOUS

## 2020-10-02 MED ORDER — MUPIROCIN 2 % EX OINT
TOPICAL_OINTMENT | CUTANEOUS | Status: DC | PRN
  Administered 2020-10-02: 1 via TOPICAL

## 2020-10-02 MED ORDER — CEFAZOLIN SODIUM 1 G IJ SOLR
INTRAMUSCULAR | Status: AC
  Filled 2020-10-02: qty 20

## 2020-10-02 MED ORDER — ACETAMINOPHEN 500 MG PO TABS
1000.0000 mg | ORAL_TABLET | Freq: Once | ORAL | Status: AC
  Administered 2020-10-02: 1000 mg via ORAL

## 2020-10-02 MED ORDER — PROMETHAZINE HCL 25 MG/ML IJ SOLN: 6.2500 mg | INTRAMUSCULAR | Status: DC | PRN

## 2020-10-02 MED ORDER — HYDROMORPHONE HCL 1 MG/ML IJ SOLN: 0.2500 mg | INTRAMUSCULAR | Status: DC | PRN

## 2020-10-02 MED ORDER — ONDANSETRON HCL 4 MG/2ML IJ SOLN
INTRAMUSCULAR | Status: DC | PRN
Start: 2020-10-02 — End: 2020-10-02
  Administered 2020-10-02: 4 mg via INTRAVENOUS

## 2020-10-02 MED ORDER — ONDANSETRON HCL 4 MG/2ML IJ SOLN
INTRAMUSCULAR | Status: AC
  Filled 2020-10-02: qty 2

## 2020-10-02 MED ORDER — FENTANYL CITRATE (PF) 100 MCG/2ML IJ SOLN
INTRAMUSCULAR | Status: AC
  Filled 2020-10-02: qty 2

## 2020-10-02 MED ORDER — ACETAMINOPHEN 500 MG PO TABS
ORAL_TABLET | ORAL | Status: AC
  Filled 2020-10-02: qty 2

## 2020-10-02 MED ORDER — PROPOFOL 10 MG/ML IV BOLUS
INTRAVENOUS | Status: AC
  Filled 2020-10-02: qty 20

## 2020-10-02 MED ORDER — ROCURONIUM BROMIDE 10 MG/ML (PF) SYRINGE
PREFILLED_SYRINGE | INTRAVENOUS | Status: AC
  Filled 2020-10-02: qty 10

## 2020-10-02 MED ORDER — OXYMETAZOLINE HCL 0.05 % NA SOLN
NASAL | Status: DC | PRN
  Administered 2020-10-02: 1 via TOPICAL

## 2020-10-02 MED ORDER — MEPERIDINE HCL 25 MG/ML IJ SOLN: 6.2500 mg | INTRAMUSCULAR | Status: DC | PRN

## 2020-10-02 MED ORDER — LIDOCAINE-EPINEPHRINE 1 %-1:100000 IJ SOLN
INTRAMUSCULAR | Status: DC | PRN
  Administered 2020-10-02: 3.5 mL

## 2020-10-02 MED ORDER — OXYCODONE HCL 5 MG PO TABS: 5.0000 mg | ORAL_TABLET | Freq: Once | ORAL | Status: DC | PRN

## 2020-10-02 MED ORDER — AMOXICILLIN 875 MG PO TABS: 875.0000 mg | ORAL_TABLET | Freq: Two times a day (BID) | ORAL | 0 refills | Status: AC

## 2020-10-02 MED ORDER — OXYMETAZOLINE HCL 0.05 % NA SOLN
NASAL | Status: AC
  Filled 2020-10-02: qty 30

## 2020-10-02 MED ORDER — OXYCODONE-ACETAMINOPHEN 5-325 MG PO TABS: 1.0000 | ORAL_TABLET | ORAL | 0 refills | Status: AC | PRN

## 2020-10-02 MED ORDER — LIDOCAINE 2% (20 MG/ML) 5 ML SYRINGE
INTRAMUSCULAR | Status: AC
  Filled 2020-10-02: qty 5

## 2020-10-02 MED ORDER — MIDAZOLAM HCL 2 MG/2ML IJ SOLN
INTRAMUSCULAR | Status: AC
  Filled 2020-10-02: qty 2

## 2020-10-02 MED ORDER — DEXAMETHASONE SODIUM PHOSPHATE 10 MG/ML IJ SOLN
INTRAMUSCULAR | Status: AC
  Filled 2020-10-02: qty 1

## 2020-10-02 MED ORDER — CEFAZOLIN SODIUM-DEXTROSE 2-3 GM-%(50ML) IV SOLR
INTRAVENOUS | Status: DC | PRN
  Administered 2020-10-02: 2 g via INTRAVENOUS

## 2020-10-02 MED ORDER — MIDAZOLAM HCL 2 MG/2ML IJ SOLN
INTRAMUSCULAR | Status: DC | PRN
  Administered 2020-10-02: 2 mg via INTRAVENOUS

## 2020-10-02 MED ORDER — LIDOCAINE 2% (20 MG/ML) 5 ML SYRINGE
INTRAMUSCULAR | Status: DC | PRN
  Administered 2020-10-02: 60 mg via INTRAVENOUS

## 2020-10-02 MED ORDER — PROPOFOL 10 MG/ML IV BOLUS
INTRAVENOUS | Status: DC | PRN
  Administered 2020-10-02: 150 mg via INTRAVENOUS

## 2020-10-02 MED ORDER — ROCURONIUM BROMIDE 10 MG/ML (PF) SYRINGE
PREFILLED_SYRINGE | INTRAVENOUS | Status: DC | PRN
  Administered 2020-10-02: 60 mg via INTRAVENOUS

## 2020-10-02 MED ORDER — OXYCODONE HCL 5 MG/5ML PO SOLN: 5.0000 mg | Freq: Once | ORAL | Status: DC | PRN

## 2020-10-02 MED ORDER — MUPIROCIN 2 % EX OINT
TOPICAL_OINTMENT | CUTANEOUS | Status: AC
  Filled 2020-10-02: qty 22

## 2020-10-02 MED ORDER — LIDOCAINE-EPINEPHRINE 1 %-1:100000 IJ SOLN
INTRAMUSCULAR | Status: AC
  Filled 2020-10-02: qty 1

## 2020-10-02 MED ORDER — DEXAMETHASONE SODIUM PHOSPHATE 10 MG/ML IJ SOLN
INTRAMUSCULAR | Status: DC | PRN
  Administered 2020-10-02: 10 mg via INTRAVENOUS

## 2020-10-02 MED ORDER — SUGAMMADEX SODIUM 200 MG/2ML IV SOLN
INTRAVENOUS | Status: DC | PRN
  Administered 2020-10-02: 300 mg via INTRAVENOUS

## 2020-10-02 MED ORDER — LACTATED RINGERS IV SOLN: INTRAVENOUS | Status: DC

## 2020-10-02 SURGICAL SUPPLY — 36 items
ATTRACTOMAT 16X20 MAGNETIC DRP (DRAPES) IMPLANT
BNDG EYE OVAL (GAUZE/BANDAGES/DRESSINGS) ×1 IMPLANT
CANISTER SUCT 1200ML W/VALVE (MISCELLANEOUS) ×2 IMPLANT
COAGULATOR SUCT 8FR VV (MISCELLANEOUS) ×2 IMPLANT
COVER WAND RF STERILE (DRAPES) IMPLANT
DECANTER SPIKE VIAL GLASS SM (MISCELLANEOUS) ×1 IMPLANT
DRSG NASOPORE 8CM (GAUZE/BANDAGES/DRESSINGS) IMPLANT
DRSG TELFA 3X8 NADH (GAUZE/BANDAGES/DRESSINGS) IMPLANT
ELECT REM PT RETURN 9FT ADLT (ELECTROSURGICAL) ×2
ELECTRODE REM PT RTRN 9FT ADLT (ELECTROSURGICAL) ×1 IMPLANT
GLOVE SURG ENC MOIS LTX SZ7 (GLOVE) ×2 IMPLANT
GLOVE SURG ENC MOIS LTX SZ7.5 (GLOVE) ×2 IMPLANT
GLOVE SURG UNDER POLY LF SZ7 (GLOVE) ×1 IMPLANT
GOWN STRL REUS W/ TWL LRG LVL3 (GOWN DISPOSABLE) ×2 IMPLANT
GOWN STRL REUS W/TWL 2XL LVL3 (GOWN DISPOSABLE) ×1 IMPLANT
GOWN STRL REUS W/TWL LRG LVL3 (GOWN DISPOSABLE) ×6
NDL HYPO 25X1 1.5 SAFETY (NEEDLE) ×1 IMPLANT
NEEDLE HYPO 25X1 1.5 SAFETY (NEEDLE) ×2 IMPLANT
NS IRRIG 1000ML POUR BTL (IV SOLUTION) ×2 IMPLANT
PACK BASIN DAY SURGERY FS (CUSTOM PROCEDURE TRAY) ×2 IMPLANT
PACK ENT DAY SURGERY (CUSTOM PROCEDURE TRAY) ×2 IMPLANT
PAD DRESSING TELFA 3X8 NADH (GAUZE/BANDAGES/DRESSINGS) IMPLANT
SLEEVE SCD COMPRESS KNEE MED (MISCELLANEOUS) ×1 IMPLANT
SOLUTION BUTLER CLEAR DIP (MISCELLANEOUS) ×2 IMPLANT
SPLINT NASAL AIRWAY SILICONE (MISCELLANEOUS) ×2 IMPLANT
SPONGE GAUZE 2X2 8PLY STRL LF (GAUZE/BANDAGES/DRESSINGS) ×1 IMPLANT
SPONGE NEURO XRAY DETECT 1X3 (DISPOSABLE) ×2 IMPLANT
SUT CHROMIC 4 0 P 3 18 (SUTURE) ×2 IMPLANT
SUT PLAIN 4 0 ~~LOC~~ 1 (SUTURE) ×2 IMPLANT
SUT PROLENE 3 0 PS 2 (SUTURE) ×2 IMPLANT
SUT VIC AB 4-0 P-3 18XBRD (SUTURE) IMPLANT
SUT VIC AB 4-0 P3 18 (SUTURE)
TOWEL GREEN STERILE FF (TOWEL DISPOSABLE) ×2 IMPLANT
TUBE SALEM SUMP 12R W/ARV (TUBING) IMPLANT
TUBE SALEM SUMP 16 FR W/ARV (TUBING) ×2 IMPLANT
YANKAUER SUCT BULB TIP NO VENT (SUCTIONS) ×2 IMPLANT

## 2020-10-02 NOTE — Anesthesia Procedure Notes (Signed)
Procedure Name: Intubation Date/Time: 10/02/2020 10:12 AM Performed by: Pearson Grippe, CRNA Pre-anesthesia Checklist: Patient identified, Emergency Drugs available, Suction available and Patient being monitored Patient Re-evaluated:Patient Re-evaluated prior to induction Oxygen Delivery Method: Circle system utilized Preoxygenation: Pre-oxygenation with 100% oxygen Induction Type: IV induction Ventilation: Mask ventilation without difficulty and Oral airway inserted - appropriate to patient size Laryngoscope Size: Hyacinth Meeker and 2 Grade View: Grade I Tube type: Oral Tube size: 7.5 mm Number of attempts: 1 Airway Equipment and Method: Stylet and Oral airway Placement Confirmation: ETT inserted through vocal cords under direct vision,  positive ETCO2 and breath sounds checked- equal and bilateral Secured at: 23 cm Tube secured with: Tape Dental Injury: Teeth and Oropharynx as per pre-operative assessment

## 2020-10-02 NOTE — Transfer of Care (Signed)
Immediate Anesthesia Transfer of Care Note  Patient: Tony Gonzales  Procedure(s) Performed: NASAL SEPTOPLASTY WITH BILATERAL TURBINATE REDUCTION (N/A Nose)  Patient Location: PACU  Anesthesia Type:General  Level of Consciousness: awake, alert  and oriented  Airway & Oxygen Therapy: Patient Spontanous Breathing and Patient connected to face mask oxygen  Post-op Assessment: Report given to RN and Post -op Vital signs reviewed and stable  Post vital signs: Reviewed and stable  Last Vitals:  Vitals Value Taken Time  BP 122/74   Temp    Pulse 101 10/02/20 1117  Resp 15 10/02/20 1117  SpO2 98 % 10/02/20 1117  Vitals shown include unvalidated device data.  Last Pain:  Vitals:   10/02/20 0820  TempSrc: Oral  PainSc: 0-No pain         Complications: No complications documented.

## 2020-10-02 NOTE — Op Note (Signed)
DATE OF PROCEDURE: 10/02/2020  OPERATIVE REPORT   SURGEON: Newman Pies, MD   PREOPERATIVE DIAGNOSES:  1. Severe nasal septal deviation.  2. Bilateral inferior turbinate hypertrophy.  3. Chronic nasal obstruction.  POSTOPERATIVE DIAGNOSES:  1. Severe nasal septal deviation.  2. Bilateral inferior turbinate hypertrophy.  3. Chronic nasal obstruction.  PROCEDURE PERFORMED:  1. Septoplasty.  2. Bilateral partial inferior turbinate resection.   ANESTHESIA: General endotracheal tube anesthesia.   COMPLICATIONS: None.   ESTIMATED BLOOD LOSS: 100 mL.   INDICATION FOR PROCEDURE: Tony Gonzales is a 50 y.o. male with a history of chronic nasal obstruction. The patient was treated with antihistamine, decongestant, and steroid nasal sprays. However, the patient continued to be symptomatic. On examination, the patient was noted to have bilateral severe inferior turbinate hypertrophy and significant nasal septal deviation, causing significant nasal obstruction. Based on the above findings, the decision was made for the patient to undergo the above-stated procedures. The risks, benefits, alternatives, and details of the procedures were discussed with the patient. Questions were invited and answered. Informed consent was obtained.   DESCRIPTION OF PROCEDURE: The patient was taken to the operating room and placed supine on the operating table. General endotracheal tube anesthesia was administered by the anesthesiologist. The patient was positioned, and prepped and draped in the standard fashion for nasal surgery. Pledgets soaked with Afrin were placed in both nasal cavities for decongestion. The pledgets were subsequently removed.   Examination of the nasal cavity revealed a severe nasal septal deviation. 1% lidocaine with 1:100,000 epinephrine was injected onto the nasal septum bilaterally. A hemitransfixion incision was made on the left side. The mucosal flap was carefully elevated on the left side. A  cartilaginous incision was made 1 cm superior to the caudal margin of the nasal septum. Mucosal flap was also elevated on the right side in the similar fashion. It should be noted that due to the severe septal deviation, the deviated portion of the cartilaginous and bony septum had to be removed in piecemeal fashion. Once the deviated portions were removed, a straight midline septum was achieved. The septum was then quilted with 4-0 plain gut sutures. The hemitransfixion incision was closed with interrupted 4-0 chromic sutures.   The inferior one half of both hypertrophied inferior turbinate was crossclamped with a Kelly clamp. The inferior one half of each inferior turbinate was then resected with a pair of cross cutting scissors. Hemostasis was achieved with a suction cautery device.  Doyle splints were applied to the nasal septum.  The care of the patient was turned over to the anesthesiologist. The patient was awakened from anesthesia without difficulty. The patient was extubated and transferred to the recovery room in good condition.   OPERATIVE FINDINGS: Severe nasal septal deviation and bilateral inferior turbinate hypertrophy.   SPECIMEN: None.   FOLLOWUP CARE: The patient be discharged home once he is awake and alert. The patient will be placed on Percocet 1 tablets p.o. q.4 hours p.r.n. pain, and amoxicillin 875 mg p.o. b.i.d. for 3 days. The patient will follow up in my office in 3 days for splint removal.   Yoshie Kosel Philomena Doheny, MD

## 2020-10-02 NOTE — H&P (Signed)
Cc: Chronic nasal obstruction  HPI: The patient is a 50 year old male who presents today complaining of chronic nasal obstruction for more than 10 years.  His nasal obstruction is worse on the left side.  He was previously diagnosed with nasal septal deviation and turbinate hypertrophy.  His symptoms have significantly worsened since October, after he had a Covid infection.  The patient has a history of environmental allergies.  He was previously treated with Flonase and multiple over-the-counter allergy medications and decongestants.  Currently he complains of difficulty breathing through his nostrils.  He denies any facial pain, fever, or visual change.  He has no previous history of ENT surgery.   The patient's review of systems (constitutional, eyes, ENT, cardiovascular, respiratory, GI, musculoskeletal, skin, neurologic, psychiatric, endocrine, hematologic, allergic) is noted in the ROS questionnaire.  It is reviewed with the patient.  Major events: None.  Ongoing medical problems: Allergies, Hay fever.  Allergies: NKDA.  Social history: The pt is married. 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 He denies the use of tobacco, alcohol or illegal drugs. He quit smoking 15 years ago.   Exam: General: Communicates without difficulty, well nourished, no acute distress. Head: Normocephalic, no evidence injury, no tenderness, facial buttresses intact without stepoff. Face/sinus: No tenderness to palpation and percussion. Facial movement is normal and symmetric. Eyes: PERRL, EOMI. No scleral icterus, conjunctivae clear. Neuro: CN II exam reveals vision grossly intact.  No nystagmus at any point of gaze. Ears: Auricles well formed without lesions.  Ear canals are intact without mass or lesion.  No erythema or edema is appreciated.  The TMs are intact without fluid. Nose: External evaluation reveals normal support and skin without lesions.  Dorsum is intact.  Anterior rhinoscopy  reveals congested mucosa over anterior aspect of inferior turbinates and deviated septum.  No purulence noted. His left nostril is severely obstructed by his left anterior septal deviation. Oral:  Oral cavity and oropharynx are intact, symmetric, without erythema or edema.  Mucosa is moist without lesions. Neck: Full range of motion without pain.  There is no significant lymphadenopathy.  No masses palpable.  Thyroid bed within normal limits to palpation.  Parotid glands and submandibular glands equal bilaterally without mass.  Trachea is midline. Neuro:  CN 2-12 grossly intact. Gait normal.   Procedure:  Flexible Nasal Endoscopy: Description: Risks, benefits, and alternatives of flexible endoscopy were explained to the patient.  Specific mention was made of the risk of throat numbness with difficulty swallowing, possible bleeding from the nose and mouth, and pain from the procedure.  The patient gave oral consent to proceed.  The flexible scope was inserted into the right nasal cavity.  Endoscopy of the interior nasal cavity, superior, inferior, and middle meatus was performed. The sphenoid-ethmoid recess was examined. Edematous mucosa was noted.  No polyp, mass, or lesion was appreciated. Nasal septal deviation noted. Olfactory cleft was clear.  Nasopharynx was clear.  Turbinates were hypertrophied but without mass.  The procedure was repeated on the contralateral side with similar findings.  The patient tolerated the procedure well.   Assessment  1.  Chronic rhinitis with nasal mucosal congestion, nasal septal deviation, and bilateral inferior turbinate hypertrophy.  His left nostril is severely obstructed by his left anterior septal deviation.  2.  No acute infection is noted today.   Plan  1.  The physical exam and nasal endoscopy findings are reviewed with the patient.  2.  Continue with Flonase nasal spray daily.  3.  Based on the above findings, the patient may benefit from undergoing surgical  intervention with septoplasty and bilateral turbinate reduction.  The risks, benefits, alternatives and details of the procedures are reviewed with the patient.  Questions are invited and answered.  4.  The patient would like to proceed with the procedures.

## 2020-10-02 NOTE — Anesthesia Postprocedure Evaluation (Signed)
Anesthesia Post Note  Patient: Belford Pascucci  Procedure(s) Performed: NASAL SEPTOPLASTY WITH BILATERAL TURBINATE REDUCTION (N/A Nose)     Patient location during evaluation: PACU Anesthesia Type: General Level of consciousness: awake and alert, oriented and patient cooperative Pain management: pain level controlled Vital Signs Assessment: post-procedure vital signs reviewed and stable Respiratory status: spontaneous breathing, nonlabored ventilation and respiratory function stable Cardiovascular status: blood pressure returned to baseline and stable Postop Assessment: no apparent nausea or vomiting Anesthetic complications: no   No complications documented.  Last Vitals:  Vitals:   10/02/20 1115 10/02/20 1130  BP: (!) 153/96 (!) 148/98  Pulse: 97 88  Resp: (!) 21 10  Temp: 36.9 C   SpO2: 98% 96%    Last Pain:  Vitals:   10/02/20 1115  TempSrc:   PainSc: 3                  Lannie Fields

## 2020-10-02 NOTE — Anesthesia Preprocedure Evaluation (Addendum)
Anesthesia Evaluation    Reviewed: Allergy & Precautions, Patient's Chart, lab work & pertinent test results  Airway Mallampati: I  TM Distance: >3 FB Neck ROM: Full    Dental no notable dental hx. (+) Teeth Intact, Dental Advisory Given   Pulmonary former smoker,  Quit smoking 1996   Pulmonary exam normal breath sounds clear to auscultation       Cardiovascular negative cardio ROS Normal cardiovascular exam Rhythm:Regular Rate:Normal     Neuro/Psych negative neurological ROS  negative psych ROS   GI/Hepatic Neg liver ROS, hiatal hernia, GERD  Controlled,  Endo/Other  negative endocrine ROS  Renal/GU negative Renal ROS  negative genitourinary   Musculoskeletal negative musculoskeletal ROS (+)   Abdominal   Peds  Hematology negative hematology ROS (+)   Anesthesia Other Findings Deviated septum, bilateral turbinate hypertrophy   Reproductive/Obstetrics negative OB ROS                            Anesthesia Physical Anesthesia Plan  ASA: I  Anesthesia Plan: General   Post-op Pain Management:    Induction: Intravenous  PONV Risk Score and Plan: 2 and Ondansetron, Dexamethasone, Midazolam and Treatment may vary due to age or medical condition  Airway Management Planned: Oral ETT  Additional Equipment: None  Intra-op Plan:   Post-operative Plan: Extubation in OR  Informed Consent: I have reviewed the patients History and Physical, chart, labs and discussed the procedure including the risks, benefits and alternatives for the proposed anesthesia with the patient or authorized representative who has indicated his/her understanding and acceptance.     Dental advisory given  Plan Discussed with: CRNA  Anesthesia Plan Comments:        Anesthesia Quick Evaluation

## 2020-10-02 NOTE — Discharge Instructions (Addendum)
POSTOPERATIVE INSTRUCTIONS FOR PATIENTS HAVING NASAL OR SINUS OPERATIONS ACTIVITY: Restrict activity at home for the first two days, resting as much as possible. Light activity is best. You may usually return to work within a week. You should refrain from nose blowing, strenuous activity, or heavy lifting greater than 20lbs for a total of one week after your operation.  If sneezing cannot be avoided, sneeze with your mouth open. DISCOMFORT: You may experience a dull headache and pressure along with nasal congestion and discharge. These symptoms may be worse during the first week after the operation but may last as long as two to four weeks.  Please take Tylenol or the pain medication that has been prescribed for you. Do not take aspirin or aspirin containing medications since they may cause bleeding.  You may experience symptoms of post nasal drainage, nasal congestion, headaches and fatigue for two or three months after your operation.  BLEEDING: You may have some blood tinged nasal drainage for approximately two weeks after the operation.  The discharge will be worse for the first week.  Please call our office at [phone] or go to the nearest hospital emergency room if you experience any of the following: heavy, bright red blood from your nose or mouth that lasts longer than 15 minutes or coughing up or vomiting bright red blood or blood clots. GENERAL CONSIDERATIONS: 1. A gauze dressing will be placed on your upper lip to absorb any drainage after the operation. You may need to change this several times a day.  If you do not have very much drainage, you may remove the dressing.  Remember that you may gently wipe your nose with a tissue and sniff in, but DO NOT blow your nose. 2. Please keep all of your postoperative appointments.  Your final results after the operation will depend on proper follow-up.  The initial visit is usually 2 to 5 days after the operation.  During this visit, the remaining nasal  packing and internal septal splints will be removed.  Your nasal and sinus cavities will be cleaned.  During the second visit, your nasal and sinus cavities will be cleaned again. Have someone drive you to your first two postoperative appointments.  3. How you care for your nose after the operation will influence the results that you obtain.  You should follow all directions, take your medication as prescribed, and call our office [phone] with any problems or questions. 4. You may be more comfortable sleeping with your head elevated on two pillows. 5. Do not take any medications that we have not prescribed or recommended. WARNING SIGNS: if any of the following should occur, please call our office: 1. Persistent fever greater than 102F. 2. Persistent vomiting. 3. Severe and constant pain that is not relieved by prescribed pain medication. 4. Trauma to the nose. 5. Rash or unusual side effects from any medicines.  No Tylenol until after 2:30pm today if needed   Post Anesthesia Home Care Instructions  Activity: Get plenty of rest for the remainder of the day. A responsible individual must stay with you for 24 hours following the procedure.  For the next 24 hours, DO NOT: -Drive a car -Advertising copywriter -Drink alcoholic beverages -Take any medication unless instructed by your physician -Make any legal decisions or sign important papers.  Meals: Start with liquid foods such as gelatin or soup. Progress to regular foods as tolerated. Avoid greasy, spicy, heavy foods. If nausea and/or vomiting occur, drink only clear liquids until the  nausea and/or vomiting subsides. Call your physician if vomiting continues.  Special Instructions/Symptoms: Your throat may feel dry or sore from the anesthesia or the breathing tube placed in your throat during surgery. If this causes discomfort, gargle with warm salt water. The discomfort should disappear within 24 hours.  If you had a scopolamine patch  placed behind your ear for the management of post- operative nausea and/or vomiting:  1. The medication in the patch is effective for 72 hours, after which it should be removed.  Wrap patch in a tissue and discard in the trash. Wash hands thoroughly with soap and water. 2. You may remove the patch earlier than 72 hours if you experience unpleasant side effects which may include dry mouth, dizziness or visual disturbances. 3. Avoid touching the patch. Wash your hands with soap and water after contact with the patch.

## 2020-10-05 ENCOUNTER — Encounter (HOSPITAL_BASED_OUTPATIENT_CLINIC_OR_DEPARTMENT_OTHER): Payer: Self-pay | Admitting: Otolaryngology

## 2021-02-20 ENCOUNTER — Other Ambulatory Visit: Payer: Self-pay

## 2021-02-20 ENCOUNTER — Ambulatory Visit
Admission: EM | Admit: 2021-02-20 | Discharge: 2021-02-20 | Disposition: A | Discharge status: Home / Self Care | Payer: BC Managed Care – PPO | Attending: Emergency Medicine | Admitting: Emergency Medicine

## 2021-02-20 DIAGNOSIS — S61412A Laceration without foreign body of left hand, initial encounter: Secondary | ICD-10-CM

## 2021-02-20 MED ORDER — CEPHALEXIN 500 MG PO CAPS: 500.0000 mg | ORAL_CAPSULE | Freq: Two times a day (BID) | ORAL | 0 refills | Status: AC

## 2021-02-20 NOTE — ED Triage Notes (Signed)
Patient presents to Urgent Care with complaints of left hand laceration today from a box cutter. He states his last tdap 3-4 years ago.

## 2021-02-20 NOTE — ED Provider Notes (Signed)
[facility]   [id number] 02/20/21 Arrival Time: 1507  CC: LACERATION  SUBJECTIVE:  Tony Gonzales is a 50 y.o. male who presents with a laceration to LT hand that occurred a few hours ago.  Cut hand with razor blade.  Bleeding controlled.  Currently not on blood thinners.  Denies similar symptoms in the past.  Denies fever, chills, nausea, vomiting, redness, swelling, purulent drainage, decrease strength or sensation.   Td UTD: Yes.  ROS: As per HPI.  All other pertinent ROS negative.     Past Medical History:  Diagnosis Date   C. difficile diarrhea 2010   Colon polyps    Diverticulitis    GERD (gastroesophageal reflux disease)    Hiatal hernia    Seasonal allergies    Past Surgical History:  Procedure Laterality Date   NASAL SEPTOPLASTY W/ TURBINOPLASTY N/A 10/02/2020   Procedure: NASAL SEPTOPLASTY WITH BILATERAL TURBINATE REDUCTION;  Surgeon: Newman Pies, MD;  Location: [facility];  Service: ENT;  Laterality: N/A;   NO PAST SURGERIES     No Known Allergies No current facility-administered medications on file prior to encounter.   No current outpatient medications on file prior to encounter.   Social History   Socioeconomic History   Marital status: Married    Spouse name: Not on file   Number of children: 2   Years of education: Not on file   Highest education level: Not on file  Occupational History   Occupation: Games developer  Tobacco Use   Smoking status: Former    Pack years: 0.00    Types: Cigarettes    Quit date: 06/24/1995    Years since quitting: 25.6   Smokeless tobacco: Former    Types: Chew    Quit date: 06/24/1995  Vaping Use   Vaping Use: Never used  Substance and Sexual Activity   Alcohol use: No    Alcohol/week: 0.0 standard drinks   Drug use: No   Sexual activity: Not on file  Other Topics Concern   Not on file  Social History Narrative   Wife is a Engineer, civil (consulting)   No regular exercise   Social Determinants of Health    Financial Resource Strain: Not on file  Food Insecurity: Not on file  Transportation Needs: Not on file  Physical Activity: Not on file  Stress: Not on file  Social Connections: Not on file  Intimate Partner Violence: Not on file   Family History  Problem Relation Age of Onset   Bladder Cancer Father    Coronary artery disease Father        In his 77's   Colon cancer Neg Hx      OBJECTIVE:  Vitals:   02/20/21 1515  BP: 132/88  Pulse: 78  Resp: 16  Temp: 98.1 F (36.7 C)  TempSrc: Oral  SpO2: 96%    General appearance: alert; no distress CV: radial pulse 2+ Hand: Strength and ROM intact about the fifth digit Skin: laceration of LT hand medial palmar aspect; size: approx 2 cm Psychological: alert and cooperative; normal mood and affect  Procedure: Verbal consent obtained. Patient provided with risks and alternatives to the procedure. Wound copiously irrigated with tap water then cleansed with betadine. Anesthetized with 4 mL of lidocaine with epinephrine. Wound carefully explored. No foreign body, tendon injury, or nonviable tissue were noted. Using sterile technique 2 horizontal 5-0 Prolene sutures were placed to reapproximate the wound. Patient tolerated procedure well. No complications. Minimal bleeding. Patient advised  to look for and return for any signs of infection such as redness, swelling, discharge, or worsening pain.   ASSESSMENT & PLAN:  1. Laceration of left hand without foreign body, initial encounter     Meds ordered this encounter  Medications   cephALEXin (KEFLEX) 500 MG capsule    Sig: Take 1 capsule (500 mg total) by mouth 2 (two) times daily for 10 days.    Dispense:  20 capsule    Refill:  0    Order Specific Question:   Supervising Provider    Answer:   Eustace Moore [[id number]]    Bandage applied Splint applied Keep covered for next and dry for next 24-48 hours.  After then you may gently clean with warm water and mild soap.  Avoid  submerging wound in water. Change dressing daily and apply a thin layer of neosporin.  Return in 10 days to have sutures removed.   Take OTC ibuprofen or tylenol as needed for pain relief Keflex for possible infection Return sooner or go to the ED if you have any new or worsening symptoms such as increased pain, redness, swelling, drainage, discharge, decreased range of motion of extremity, etc..     Reviewed expectations re: course of current medical issues. Questions answered. Outlined signs and symptoms indicating need for more acute intervention. Patient verbalized understanding. After Visit Summary given.    Rennis Harding, PA-C 02/20/21 1552

## 2021-02-20 NOTE — Discharge Instructions (Addendum)
Bandage applied Splint applied Keep covered for next and dry for next 24-48 hours.  After then you may gently clean with warm water and mild soap.  Avoid submerging wound in water. Change dressing daily and apply a thin layer of neosporin.  Return in 10 days to have sutures removed.   Take OTC ibuprofen or tylenol as needed for pain relief Keflex for possible infection Return sooner or go to the ED if you have any new or worsening symptoms such as increased pain, redness, swelling, drainage, discharge, decreased range of motion of extremity, etc..

## 2021-04-06 ENCOUNTER — Encounter: Payer: Self-pay | Admitting: Gastroenterology

## 2022-10-27 ENCOUNTER — Ambulatory Visit
Admission: EM | Admit: 2022-10-27 | Discharge: 2022-10-27 | Disposition: A | Discharge status: Home / Self Care | Payer: BC Managed Care – PPO | Attending: Nurse Practitioner | Admitting: Nurse Practitioner

## 2022-10-27 ENCOUNTER — Other Ambulatory Visit: Payer: Self-pay

## 2022-10-27 ENCOUNTER — Encounter: Payer: Self-pay | Admitting: Emergency Medicine

## 2022-10-27 DIAGNOSIS — S6991XA Unspecified injury of right wrist, hand and finger(s), initial encounter: Secondary | ICD-10-CM

## 2022-10-27 MED ORDER — MUPIROCIN 2 % EX OINT: 1.0000 | TOPICAL_OINTMENT | Freq: Two times a day (BID) | CUTANEOUS | 0 refills | Status: DC

## 2022-10-27 MED ORDER — AMOXICILLIN-POT CLAVULANATE 875-125 MG PO TABS: 1.0000 | ORAL_TABLET | Freq: Two times a day (BID) | ORAL | 0 refills | Status: DC

## 2022-10-27 NOTE — ED Triage Notes (Signed)
Pt reports was wood working with sawzall and reports locking device on blade punctured right thumb. Pt reports similar injury 3 months ago. Last tetanus shot approx 2 years.

## 2022-10-27 NOTE — ED Provider Notes (Signed)
[facility]    CSN: [id number] Arrival date & time: 10/27/22  1941      History   Chief Complaint Chief Complaint  Patient presents with   Finger Injury    HPI Tony Gonzales is a 52 y.o. male.   The history is provided by the patient and the spouse.   Patient presents for complaints of an injury to the right from the occurred today.  Patient reports he was doing woodwork with a sawzall when the locking device on the blade punctured the right thumb nail.  Patient reports he was wearing gloves when the injury occurred.  Patient states that the nail went under the edge of his cuticle, but he was able to pull the nail back into place.  Patient was able to control the bleeding at this time.  He denies numbness, tingling, radiation of pain, sensation or decreased range of motion.Marland Kitchen  He states he has not taken any medication for his symptoms.  Denies use of blood thinners.  Reports his last tetanus shot was approximately 2 years ago.  Past Medical History:  Diagnosis Date   C. 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 Authorizing Provider  amoxicillin-clavulanate (AUGMENTIN) 875-125 MG tablet Take 1 tablet by mouth every 12 (twelve) hours. 10/27/22  Yes Daanya Lanphier-Warren, Alda Lea, NP  mupirocin  ointment (BACTROBAN) 2 % Apply 1 Application topically 2 (two) times daily. 10/27/22  Yes Tyrika Newman-Warren, Alda Lea, NP    Family History Family History  Problem Relation Age of Onset   Bladder Cancer Father    Coronary artery disease Father        In his 37's   Colon cancer Neg Hx     Social History Social History   Tobacco Use   Smoking status: Former    Types: Cigarettes    Quit date: 06/24/1995    Years since quitting: 27.3   Smokeless tobacco: Former    Types: Chew    Quit date: 06/24/1995  Vaping Use   Vaping Use: Never used  Substance Use Topics   Alcohol use: No    Alcohol/week: 0.0 standard drinks of alcohol   Drug use: No     Allergies   Patient has no known allergies.   Review of Systems Review of Systems Per HPI  Physical Exam Triage Vital Signs ED Triage Vitals [10/27/22 1949]  Enc Vitals Group     BP (!) 157/93     Pulse Rate 70     Resp 20     Temp 98 F (36.7 C)     Temp Source Oral     SpO2 98 %     Weight      Height  Head Circumference      Peak Flow      Pain Score 5     Pain Loc      Pain Edu?      Excl. in [place]?    No data found.  Updated Vital Signs BP (!) 157/93 (BP Location: Right Arm)   Pulse 70   Temp 98 F (36.7 C) (Oral)   Resp 20   SpO2 98%   Visual Acuity Right Eye Distance:   Left Eye Distance:   Bilateral Distance:    Right Eye Near:   Left Eye Near:    Bilateral Near:     Physical Exam Vitals and nursing note reviewed.  Constitutional:      General: He is not in acute distress.    Appearance: Normal appearance.  Eyes:     Extraocular Movements: Extraocular movements intact.     Pupils: Pupils are equal, round, and reactive to light.  Pulmonary:     Effort: Pulmonary effort is normal.  Musculoskeletal:     Right hand: Normal range of motion. Normal capillary refill. Normal pulse.     Comments: Crush injury to the right thumb with disruption to the nailbed.  Nailbed with previous deformity due to  prior injury.  The lateral aspect of the nailbed with separation, is loose.  Unable to remove the remaining portion of the nail.  Bleeding is controlled at this time.  Bruising noted to the lateral aspect of the nailbed and cuticle.  Skin:    General: Skin is warm and dry.  Neurological:     General: No focal deficit present.     Mental Status: He is alert and oriented to person, place, and time.  Psychiatric:        Mood and Affect: Mood normal.        Behavior: Behavior normal.      UC Treatments / Results  Labs (all labs ordered are listed, but only abnormal results are displayed) Labs Reviewed - No data to display  EKG   Radiology No results found.  Procedures Procedures (including critical care time)  Medications Ordered in UC Medications - No data to display  Initial Impression / Assessment and Plan / UC Course  I have reviewed the triage vital signs and the nursing notes.  Pertinent labs & imaging results that were available during my care of the patient were reviewed by me and considered in my medical decision making (see chart for details).  The patient is well-appearing, he is in no acute distress, vital signs are stable.  Patient with crush injury to the right thumb with a sawzall.  Nail of the right thumb is disrupted, but remains attached to the nailbed.  Patient with full range of motion and full sensation of the right thumb.  Patient declined imaging at this time.  Symptoms are consistent with a crush injury.  Will treat patient empirically with mupirocin 2% ointment.  Advised patient that it is recommended that he begin Augmentin 75/125 mg tablets to cover for possible infection.  Patient states that he will continue to monitor his symptoms for worsening and begin medication at that time.  Supportive care recommendations were discussed with the patient to include the application of ice to help with pain or swelling, over-the-counter analgesics, and wound care  instructions with strict follow-up precautions.  Patient was in agreement with this plan of care and verbalizes understanding.  All questions were answered.  Patient stable for discharge.   Final  Clinical Impressions(s) / UC Diagnoses   Final diagnoses:  Thumb injury, right, initial encounter     Discharge Instructions      Apply antibiotic ointment as prescribed. As discussed, if you begin to experience swelling, or redness in the right thumb, begin the antibiotics prescribed. May apply ice to help with pain or swelling. Keep the area clean and dry.  Recommend cleaning it with warm water at least twice daily.  Area covered when you are out or when you are at work.  When you are home, may leave open to air. May take over-the-counter Tylenol as needed for pain, fever, or general discomfort. If symptoms worsen, or if you have other concerns, please follow-up in this office or with your primary care physician for further evaluation. Follow-up as needed.     ED Prescriptions     Medication Sig Dispense Auth. Provider   mupirocin ointment (BACTROBAN) 2 % Apply 1 Application topically 2 (two) times daily. 22 g Kensley Lares-Warren, Alda Lea, NP   amoxicillin-clavulanate (AUGMENTIN) 875-125 MG tablet Take 1 tablet by mouth every 12 (twelve) hours. 14 tablet Cal Gindlesperger-Warren, Alda Lea, NP      PDMP not reviewed this encounter.   Tish Men, NP 10/27/22 2016

## 2022-10-27 NOTE — ED Notes (Signed)
Site cleaned with Hibiclens and normal saline. Bacitracin,non adherent pad, gauze pad, secured with coban. Pt tolerated well.  Site management and infection prevention education reviewed. Pt and pt family verbalized understanding.

## 2022-10-27 NOTE — Discharge Instructions (Addendum)
Apply antibiotic ointment as prescribed. As discussed, if you begin to experience swelling, or redness in the right thumb, begin the antibiotics prescribed. May apply ice to help with pain or swelling. Keep the area clean and dry.  Recommend cleaning it with warm water at least twice daily.  Area covered when you are out or when you are at work.  When you are home, may leave open to air. May take over-the-counter Tylenol as needed for pain, fever, or general discomfort. If symptoms worsen, or if you have other concerns, please follow-up in this office or with your primary care physician for further evaluation. Follow-up as needed.

## 2023-08-21 ENCOUNTER — Emergency Department (HOSPITAL_COMMUNITY)
Admission: EM | Admit: 2023-08-21 | Discharge: 2023-08-22 | Disposition: A | Discharge status: Home / Self Care | Payer: BC Managed Care – PPO | Attending: Emergency Medicine | Admitting: Emergency Medicine

## 2023-08-21 ENCOUNTER — Emergency Department (HOSPITAL_COMMUNITY): Payer: BC Managed Care – PPO

## 2023-08-21 ENCOUNTER — Other Ambulatory Visit: Payer: Self-pay

## 2023-08-21 ENCOUNTER — Encounter (HOSPITAL_COMMUNITY): Payer: Self-pay

## 2023-08-21 DIAGNOSIS — S6992XA Unspecified injury of left wrist, hand and finger(s), initial encounter: Secondary | ICD-10-CM | POA: Diagnosis present

## 2023-08-21 DIAGNOSIS — S62515B Nondisplaced fracture of proximal phalanx of left thumb, initial encounter for open fracture: Secondary | ICD-10-CM | POA: Insufficient documentation

## 2023-08-21 DIAGNOSIS — R000 Tachycardia, unspecified: Secondary | ICD-10-CM | POA: Insufficient documentation

## 2023-08-21 DIAGNOSIS — W268XXA Contact with other sharp object(s), not elsewhere classified, initial encounter: Secondary | ICD-10-CM | POA: Insufficient documentation

## 2023-08-21 MED ORDER — OXYCODONE-ACETAMINOPHEN 5-325 MG PO TABS
1.0000 | ORAL_TABLET | Freq: Once | ORAL | Status: AC
  Administered 2023-08-21: 1 via ORAL
  Filled 2023-08-21: qty 1

## 2023-08-21 MED ORDER — ONDANSETRON HCL 4 MG/2ML IJ SOLN
4.0000 mg | Freq: Once | INTRAMUSCULAR | Status: DC
Start: 2023-08-21 — End: 2023-08-21

## 2023-08-21 MED ORDER — OXYCODONE-ACETAMINOPHEN 5-325 MG PO TABS
1.0000 | ORAL_TABLET | Freq: Once | ORAL | Status: AC
Start: 2023-08-21 — End: 2023-08-21
  Administered 2023-08-21: 1 via ORAL
  Filled 2023-08-21: qty 1

## 2023-08-21 MED ORDER — CEFAZOLIN SODIUM-DEXTROSE 2-4 GM/100ML-% IV SOLN
2.0000 g | Freq: Once | INTRAVENOUS | Status: AC
  Administered 2023-08-21: 2 g via INTRAVENOUS
  Filled 2023-08-21: qty 100

## 2023-08-21 MED ORDER — LIDOCAINE HCL (PF) 1 % IJ SOLN
10.0000 mL | Freq: Once | INTRAMUSCULAR | Status: AC
  Administered 2023-08-21: 10 mL
  Filled 2023-08-21: qty 10

## 2023-08-21 MED ORDER — HYDROMORPHONE HCL 1 MG/ML IJ SOLN
0.5000 mg | Freq: Once | INTRAMUSCULAR | Status: AC
  Administered 2023-08-21: 0.5 mg via INTRAVENOUS
  Filled 2023-08-21: qty 1

## 2023-08-21 MED ORDER — CEPHALEXIN 250 MG PO CAPS
500.0000 mg | ORAL_CAPSULE | Freq: Once | ORAL | Status: AC
  Administered 2023-08-21: 500 mg via ORAL
  Filled 2023-08-21: qty 2

## 2023-08-21 NOTE — ED Triage Notes (Signed)
Pt was using a power circular saw and lacerated left thumb. Pt has a laceration from posterior base of left thumb to top of thumb. Pt's lac is approx 3 in long. Bleeding is controlled

## 2023-08-21 NOTE — ED Provider Triage Note (Cosign Needed)
Emergency Medicine Provider Triage Evaluation Note  Tony Gonzales , a 52 y.o. male  was evaluated in triage.  Pt complains of laceration to L thumb 2 hours ago w/circular saw. Denies numbness/tingling. Last tdap 2022.  Review of Systems  Positive: wound Negative: numbness  Physical Exam  BP (!) 166/96   Pulse (!) 120   Temp 98.7 F (37.1 C) (Oral)   Resp 18   Ht 6\' 1"  (1.854 m)   Wt 101.3 kg   SpO2 98%   BMI 29.46 kg/m  Gen:   Awake, no distress   Resp:  Normal effort  MSK:   Moves extremities without difficulty  Other:  TTP of 1st phalanx, 4cm laceration to L thumb, no snuffbox ttp  Medical Decision Making  Medically screening exam initiated at 3:30 PM.  Appropriate orders placed.  Tony Gonzales was informed that the remainder of the evaluation will be completed by another provider, this initial triage assessment does not replace that evaluation, and the importance of remaining in the ED until their evaluation is complete.     Tony Gonzales, [place] 08/21/23 1531

## 2023-08-21 NOTE — Consult Note (Signed)
Orthopaedic Surgery Hand and Upper Extremity History and Physical Examination 08/21/2023  Referring Provider: No referring provider defined for this encounter.  CC: Left thumb open fracture and extensor tendon laceration due to  saw injury.  HPI: Tony Gonzales is a 52 y.o. male who sustained a circular saw injury to the dorsal aspect of the left thumb this evening.  Was given IV antibiotics and tetanus prior to my evaluation.   Past Medical History: Past Medical History:  Diagnosis Date   C. difficile diarrhea 2010   Colon polyps    Diverticulitis    GERD (gastroesophageal reflux disease)    Hiatal hernia    Seasonal allergies      Medications: Scheduled Meds: Continuous Infusions: PRN Meds:.  Allergies: Allergies as of 08/21/2023   (No Known Allergies)    Past Surgical History: Past Surgical History:  Procedure Laterality Date   NASAL SEPTOPLASTY W/ TURBINOPLASTY N/A 10/02/2020   Procedure: NASAL SEPTOPLASTY WITH BILATERAL TURBINATE REDUCTION;  Surgeon: Newman Pies, MD;  Location: [facility];  Service: ENT;  Laterality: N/A;   NO PAST SURGERIES       Social History: Social History   Occupational History   Occupation: Games developer  Tobacco Use   Smoking status: Former    Current packs/day: 0.00    Types: Cigarettes    Quit date: 06/24/1995    Years since quitting: 28.1   Smokeless tobacco: Former    Types: Chew    Quit date: 06/24/1995  Vaping Use   Vaping status: Never Used  Substance and Sexual Activity   Alcohol use: No    Alcohol/week: 0.0 standard drinks of alcohol   Drug use: No   Sexual activity: Not on file     Family History: Family History  Problem Relation Age of Onset   Bladder Cancer Father    Coronary artery disease Father        In his 46's   Colon cancer Neg Hx    Otherwise, no relevant orthopaedic family history  ROS: Review of Systems: All systems reviewed and are negative except that mentioned in  HPI  Work/Sport/Hobbies: See HPI  Physical Examination: Vitals:   08/21/23 1715 08/21/23 2004  BP: (!) 155/89   Pulse: (!) 120   Resp: 18   Temp:  98.2 F (36.8 C)  SpO2: 100%    Constitutional: Awake, alert.  WN/WD Appearance: healthy, no acute distress, well-groomed Affect: Normal HEENT: EOMI, mucous membranes moist CV: RRR Pulm: breathing comfortably  Neck:   FROM, no pain  Left Upper Extremity / Hand Laceration extending over ulno-dorsal aspect of thumb.  No pulsatile bleeding present.  Diminished but intact sensation distal to the laceration over the dorsal thumb proximal to the eponychial fold.  Able to extend slightly at IP joint but limited by pain.  Sensation intact on volar thumb.  Brisk cap refill of thumb.   Pertinent Labs: n/a  Imaging: I have personally reviewed the following studies: There is a cortical disruption on the dorsal proximal phalanx of the thumb, incomplete fracture.  Additional Studies: n/a  Assessment/Plan: Open fracture of thumb proximal phalanx from table saw injury to left thumb   Will plan for the ED to wash out and loosely approximate the wound and then have a thumb spica splint put in place.  Plan for dc on PO abx.  F/u in 5-7 days in clinic.  Possible thumb extensor tendon laceration present.  Will make sure patient does not develop an infection  first and then will get a better exam in clinic once not as painful and assess whether possible surgery is needed.  He and his wife were in agreement with the plan.  All questions were answered.   Shaune Pollack, MD Hand and Upper Extremity Surgery The [facility] [place] [phone] 08/21/2023 8:12 PM

## 2023-08-21 NOTE — ED Notes (Signed)
Suture cart at bedside 

## 2023-08-21 NOTE — ED Provider Notes (Cosign Needed)
EMERGENCY DEPARTMENT AT [facility] Provider Note   CSN: [id number] Arrival date & time: 08/21/23  1505     History Chief Complaint  Patient presents with   Laceration    Tony Gonzales is a 52 y.o. male reportedly otherwise healthy presents to the emergency room today for evaluation of left thumb injury.  The patient reports that around 1300 today, he was working a circular saw and slipped and cut his thumb.  Reports his tetanus is up-to-date in 2022.  He is right-hand dominant.  Reports some tingling into the thumb.  Denies any bleeding disorders.  No known drug allergies.   Laceration      Home Medications Prior to Admission medications   Medication Sig Start Date End Date Taking? Authorizing Provider  amoxicillin-clavulanate (AUGMENTIN) 875-125 MG tablet Take 1 tablet by mouth every 12 (twelve) hours. 10/27/22   Leath-Warren, Sadie Haber, NP  mupirocin ointment (BACTROBAN) 2 % Apply 1 Application topically 2 (two) times daily. 10/27/22   Leath-Warren, Sadie Haber, NP      Allergies    Patient has no known allergies.    Review of Systems   Review of Systems  Skin:  Positive for wound.    Physical Exam Updated Vital Signs BP (!) 155/89 (BP Location: Right Arm)   Pulse (!) 120   Temp 98.7 F (37.1 C) (Oral)   Resp 18   Ht 6\' 1"  (1.854 m)   Wt 101.3 kg   SpO2 100%   BMI 29.46 kg/m  Physical Exam Vitals and nursing note reviewed.  Constitutional:      General: He is not in acute distress.    Appearance: He is not toxic-appearing.  Eyes:     General: No scleral icterus. Cardiovascular:     Rate and Rhythm: Tachycardia present.  Pulmonary:     Effort: Pulmonary effort is normal. No respiratory distress.  Musculoskeletal:        General: Signs of injury present.     Comments: Significant injury/laceration noted to the dorsal aspect of the left thumb extending into the thenar eminence.  Thumb is held in a flexed position.  Unable to extend  the distal tip.  Brisk cap refill.  Does have some mild loss of sensation to the dorsum of the thumb.  Is able to AB and adduct the thumb.  Full radial pulses.  Compartments are soft.  No injury seen to the nail. Please see images.   Skin:    General: Skin is warm and dry.  Neurological:     Mental Status: He is alert.          ED Results / Procedures / Treatments   Labs (all labs ordered are listed, but only abnormal results are displayed) Labs Reviewed - No data to display  EKG None  Radiology DG Hand Complete Left Result Date: 08/21/2023 CLINICAL DATA:  Left thumb laceration via circular saw. EXAM: LEFT HAND - COMPLETE 3+ VIEW COMPARISON:  Left index finger radiographs 05/12/2015 FINDINGS: There is bandage material overlying the thumb metacarpal through distal phalanx. There is oblique linear lucency extending from the lateral/radial cortex of the proximal metaphysis of the proximal phalanx of the thumb distally and medially toward the distal medial metaphysis. There is an associated entrance wound defect of the proximal lateral cortex of the thumb proximal phalanx. This saw injury does not appear to extend through the distal medial metaphysis of the thumb proximal phalanx, however on lateral view there does appear  to be mild superficial bone loss within the dorsal aspect of the distal shaft of the proximal phalanx of the thumb. There are multiple scattered 2 mm and smaller bone fragments extending peripherally from the initial saw entrance into the proximal lateral aspect of the proximal phalanx of the thumb. There is lucency suggesting proximal lateral thumb subcutaneous air from this open injury. There is a 1 mm metallic density within the mid aspect of the oblique fracture of the proximal phalanx. No dislocation.  Joint spaces are preserved. IMPRESSION: Open fracture of the proximal phalanx of the thumb with a oblique linear lucency extending from the lateral/radial cortex of the  proximal metaphysis of the proximal phalanx distally and medially toward the distal medial metaphysis. There is a 1 mm metallic density within the mid aspect of the fracture. Electronically Signed   By: Neita Garnet M.D.   On: 08/21/2023 17:47    Procedures .Laceration Repair  Date/Time: 08/21/2023 10:08 PM  Performed by: Achille Rich, PA-C Authorized by: Achille Rich, PA-C   Consent:    Consent obtained:  Verbal   Consent given by:  Patient   Risks, benefits, and alternatives were discussed: yes     Risks discussed:  Infection, pain, nerve damage, need for additional repair, retained foreign body, poor cosmetic result, tendon damage and poor wound healing   Alternatives discussed:  No treatment Universal protocol:    Procedure explained and questions answered to patient or proxy's satisfaction: yes     Imaging studies available: yes     Patient identity confirmed:  Verbally with patient Anesthesia:    Anesthesia method:  Local infiltration   Local anesthetic:  Lidocaine 1% w/o epi Laceration details:    Location:  Finger   Finger location:  L thumb   Length (cm):  8 Pre-procedure details:    Preparation:  Patient was prepped and draped in usual sterile fashion and imaging obtained to evaluate for foreign bodies Exploration:    Imaging obtained: x-ray     Imaging outcome: foreign body noted     Wound exploration: wound explored through full range of motion and entire depth of wound visualized     Wound extent: tendon damage and underlying fracture     Wound extent: no foreign body     Tendon damage location:  Upper extremity   Tendon damage extent:  Partial transection   Contaminated: no   Treatment:    Area cleansed with:  Shur-Clens and saline   Amount of cleaning:  Extensive   Irrigation solution:  Sterile saline   Irrigation volume:  3L   Irrigation method:  Pressure wash   Visualized foreign bodies/material removed: no   Skin repair:    Repair method:  Sutures    Suture size:  4-0   Suture material:  Prolene   Suture technique:  Simple interrupted   Number of sutures:  9 Approximation:    Approximation:  Loose Repair type:    Repair type:  Intermediate Post-procedure details:    Dressing:  Non-adherent dressing and splint for protection (Xeroform & nonadherent and splint)   Procedure completion:  Tolerated well, no immediate complications    Medications Ordered in ED Medications  lidocaine (PF) (XYLOCAINE) 1 % injection 10 mL (has no administration in time range)  cephALEXin (KEFLEX) capsule 500 mg (500 mg Oral Given 08/21/23 1542)  oxyCODONE-acetaminophen (PERCOCET/ROXICET) 5-325 MG per tablet 1 tablet (1 tablet Oral Given 08/21/23 1542)  ceFAZolin (ANCEF) IVPB 2g/100 mL premix (2 g Intravenous  New Bag/Given 08/21/23 1717)    ED Course/ Medical Decision Making/ A&P Clinical Course as of 08/21/23 1809  Mon Aug 21, 2023  1736 Thumb laceration.  [CC]  1738 On the phone with hand surgery.  [RR]    Clinical Course User Index [CC] Glyn Ade, MD [RR] Achille Rich, PA-C   Medical Decision Making Risk Prescription drug management.   52 y.o. male presents to the ER for evaluation of thumb laceration. Differential diagnosis includes but is not limited to open fracture, tendon ligament injury, laceration. Vital signs tachycardia, elevated blood pressure 135 or 89 otherwise unremarkable.  Patient does have some component of anxiety and contributing to this tachycardia. Physical exam as noted above.   2g of Ancef ordered. Patient reports that his pain is controlled. Declines additional pain medication at this time.   XR imaging shows open fracture of the proximal phalanx of the thumb with a oblique linear lucency extending from the lateral/radial cortex of the proximal metaphysis of the proximal phalanx distally and medially toward the distal medial metaphysis. There is a 1 mm metallic density within the mid aspect of the fracture. Per  radiologist's interpretation.    I spoke with Dr. Kerry Fort, hand surgery.  He will come assess the patient at bedside.  Does not think this patient needs OR at this time given that his increased risk for infection.   The hand surgeon has assessed the patient at bedside.  Thinks that the extensor tendon is actually intact/partially intact.  Again, does not recommend any OR involvement emergently.  Will let the wound irrigated and loosely closed.  Would like the patient placed in a thumb spica and he will see them later in clinic.  Tachycardia improved.   Please see procedure note.  I discussed with patient in to be other at bedside risk and benefits of doing the procedure.  Discussed about the retained foreign body and out there is a possibility of not being able to remove it.  We discussed that he will need to see hand surgery after this.  They verbalized understanding and like to continue with the procedure.  The wound was anesthetized with lidocaine 1%, approximately 7 cc.  Anesthesia achieved.  The wound was irrigated with 3 L of normal saline and cleansed with Shur-Clens.  I was not able to see any metallic foreign body however it was very small and it was irrigated profusely.  I then explored the wound and still did not see any metallic foreign bodies or other foreign bodies.  9 sutures were placed loosely.  Wound is to be covered with Xeroform and splint.  The patient received Ancef while here, will discharge him home with Duricef twice daily for the next week.  Information for Dr. Virgina Jock office given in the discharge paperwork.  I have also discharged with some narcotics for breakthrough pain.  Currently there is a delay in care due to a thumb spica splint need to be placed and the waiting time/waiting list for Ortho tech.  Will handoff to oncoming shift to recheck NV status. The patient is aware of the discharge instructions, splint care, pain management and the need to follow up with Dr.  Kerry Fort.  We discussed the results of the labs/imaging. The plan is take antibiotics as prescribed, take pain medication as needed, wound care, follow-up with hand surgery. We discussed strict return precautions and red flag symptoms. The patient verbalized their understanding and agrees to the plan. The patient is stable and being discharged  home in good condition.  Portions of this report may have been transcribed using voice recognition software. Every effort was made to ensure accuracy; however, inadvertent computerized transcription errors may be present.   Final Clinical Impression(s) / ED Diagnoses Final diagnoses:  Open nondisplaced fracture of proximal phalanx of left thumb, initial encounter    Rx / DC Orders ED Discharge Orders          Ordered    cefadroxil (DURICEF) 500 MG capsule  2 times daily        08/22/23 0019    oxyCODONE-acetaminophen (PERCOCET/ROXICET) 5-325 MG tablet  Every 6 hours PRN        08/22/23 0019              Achille Rich, PA-C 08/22/23 [phone]

## 2023-08-22 MED ORDER — CEFADROXIL 500 MG PO CAPS: 500.0000 mg | ORAL_CAPSULE | Freq: Two times a day (BID) | ORAL | 0 refills | Status: AC

## 2023-08-22 MED ORDER — OXYCODONE-ACETAMINOPHEN 5-325 MG PO TABS: 1.0000 | ORAL_TABLET | Freq: Four times a day (QID) | ORAL | 0 refills | Status: AC | PRN

## 2023-08-22 NOTE — ED Notes (Signed)
Ortho tech called 

## 2023-08-22 NOTE — Progress Notes (Signed)
 Orthopedic Tech Progress Note Patient Details:  Tony Gonzales [birth year] [id number]  Ortho Devices Type of Ortho Device: Thumb spica splint Splint Material: Fiberglass Ortho Device/Splint Location: lue Ortho Device/Splint Interventions: Ordered, Application, Adjustment   Post Interventions Patient Tolerated: Well Instructions Provided: Care of device, Adjustment of device  Chandra Dorn PARAS 08/22/2023, 2:05 AM

## 2023-09-12 ENCOUNTER — Other Ambulatory Visit: Payer: Self-pay | Admitting: Orthopedic Surgery

## 2023-09-13 ENCOUNTER — Encounter (HOSPITAL_BASED_OUTPATIENT_CLINIC_OR_DEPARTMENT_OTHER): Payer: Self-pay | Admitting: Orthopedic Surgery

## 2023-09-13 ENCOUNTER — Other Ambulatory Visit: Payer: Self-pay

## 2023-09-15 ENCOUNTER — Ambulatory Visit (HOSPITAL_BASED_OUTPATIENT_CLINIC_OR_DEPARTMENT_OTHER): Payer: 59

## 2023-09-15 ENCOUNTER — Ambulatory Visit (HOSPITAL_BASED_OUTPATIENT_CLINIC_OR_DEPARTMENT_OTHER)
Admission: RE | Admit: 2023-09-15 | Discharge: 2023-09-15 | Disposition: A | Discharge status: Home / Self Care | Payer: 59 | Attending: Orthopedic Surgery | Admitting: Orthopedic Surgery

## 2023-09-15 ENCOUNTER — Encounter (HOSPITAL_BASED_OUTPATIENT_CLINIC_OR_DEPARTMENT_OTHER)
Admission: RE | Disposition: A | Discharge status: Home / Self Care | Payer: Self-pay | Source: Home / Self Care | Attending: Orthopedic Surgery

## 2023-09-15 ENCOUNTER — Other Ambulatory Visit: Payer: Self-pay

## 2023-09-15 ENCOUNTER — Encounter (HOSPITAL_BASED_OUTPATIENT_CLINIC_OR_DEPARTMENT_OTHER): Payer: Self-pay | Admitting: Orthopedic Surgery

## 2023-09-15 ENCOUNTER — Ambulatory Visit (HOSPITAL_BASED_OUTPATIENT_CLINIC_OR_DEPARTMENT_OTHER): Payer: 59 | Admitting: Anesthesiology

## 2023-09-15 DIAGNOSIS — S62502A Fracture of unspecified phalanx of left thumb, initial encounter for closed fracture: Secondary | ICD-10-CM | POA: Diagnosis not present

## 2023-09-15 DIAGNOSIS — Z87891 Personal history of nicotine dependence: Secondary | ICD-10-CM | POA: Insufficient documentation

## 2023-09-15 DIAGNOSIS — W312XXA Contact with powered woodworking and forming machines, initial encounter: Secondary | ICD-10-CM | POA: Diagnosis not present

## 2023-09-15 DIAGNOSIS — S62512B Displaced fracture of proximal phalanx of left thumb, initial encounter for open fracture: Secondary | ICD-10-CM | POA: Insufficient documentation

## 2023-09-15 DIAGNOSIS — Z01818 Encounter for other preprocedural examination: Secondary | ICD-10-CM

## 2023-09-15 DIAGNOSIS — S66222A Laceration of extensor muscle, fascia and tendon of left thumb at wrist and hand level, initial encounter: Secondary | ICD-10-CM | POA: Insufficient documentation

## 2023-09-15 HISTORY — PX: TENDON REPAIR: SHX5111

## 2023-09-15 HISTORY — PX: CLOSED REDUCTION FINGER WITH PERCUTANEOUS PINNING: SHX5612

## 2023-09-15 SURGERY — CLOSED REDUCTION, FINGER, WITH PERCUTANEOUS PINNING
Anesthesia: General | Site: Thumb | Laterality: Left

## 2023-09-15 MED ORDER — FENTANYL CITRATE (PF) 100 MCG/2ML IJ SOLN
INTRAMUSCULAR | Status: DC | PRN
  Administered 2023-09-15 (×2): 50 ug via INTRAVENOUS

## 2023-09-15 MED ORDER — KETOROLAC TROMETHAMINE 30 MG/ML IJ SOLN
INTRAMUSCULAR | Status: AC
  Filled 2023-09-15: qty 1

## 2023-09-15 MED ORDER — KETOROLAC TROMETHAMINE 30 MG/ML IJ SOLN
INTRAMUSCULAR | Status: DC | PRN
  Administered 2023-09-15: 30 mg via INTRAVENOUS

## 2023-09-15 MED ORDER — CEFAZOLIN SODIUM-DEXTROSE 2-4 GM/100ML-% IV SOLN
INTRAVENOUS | Status: AC
  Filled 2023-09-15: qty 100

## 2023-09-15 MED ORDER — MIDAZOLAM HCL 5 MG/5ML IJ SOLN
INTRAMUSCULAR | Status: DC | PRN
  Administered 2023-09-15: 2 mg via INTRAVENOUS

## 2023-09-15 MED ORDER — HYDROMORPHONE HCL 1 MG/ML IJ SOLN
INTRAMUSCULAR | Status: DC | PRN
  Administered 2023-09-15: .5 mg via INTRAVENOUS

## 2023-09-15 MED ORDER — 0.9 % SODIUM CHLORIDE (POUR BTL) OPTIME
TOPICAL | Status: DC | PRN
  Administered 2023-09-15: 1000 mL

## 2023-09-15 MED ORDER — SODIUM CHLORIDE 0.9 % IV SOLN: INTRAVENOUS | Status: DC | PRN

## 2023-09-15 MED ORDER — POVIDONE-IODINE 10 % EX SWAB
2.0000 | Freq: Once | CUTANEOUS | Status: AC
  Administered 2023-09-15: 2 via TOPICAL

## 2023-09-15 MED ORDER — FENTANYL CITRATE (PF) 100 MCG/2ML IJ SOLN: 25.0000 ug | INTRAMUSCULAR | Status: DC | PRN

## 2023-09-15 MED ORDER — LIDOCAINE HCL (PF) 1 % IJ SOLN
INTRAMUSCULAR | Status: AC
  Filled 2023-09-15: qty 30

## 2023-09-15 MED ORDER — PROPOFOL 10 MG/ML IV BOLUS
INTRAVENOUS | Status: DC | PRN
  Administered 2023-09-15: 200 mg via INTRAVENOUS

## 2023-09-15 MED ORDER — LIDOCAINE 2% (20 MG/ML) 5 ML SYRINGE
INTRAMUSCULAR | Status: AC
  Filled 2023-09-15: qty 5

## 2023-09-15 MED ORDER — DEXMEDETOMIDINE HCL IN NACL 80 MCG/20ML IV SOLN
INTRAVENOUS | Status: DC | PRN
  Administered 2023-09-15: 8 ug via INTRAVENOUS

## 2023-09-15 MED ORDER — LIDOCAINE HCL (PF) 1 % IJ SOLN
INTRAMUSCULAR | Status: DC | PRN
  Administered 2023-09-15: 9 mL via SURGICAL_CAVITY

## 2023-09-15 MED ORDER — ONDANSETRON HCL 4 MG/2ML IJ SOLN
4.0000 mg | Freq: Once | INTRAMUSCULAR | Status: DC | PRN
Start: 2023-09-15 — End: 2023-09-15

## 2023-09-15 MED ORDER — FENTANYL CITRATE (PF) 100 MCG/2ML IJ SOLN
INTRAMUSCULAR | Status: AC
  Filled 2023-09-15: qty 2

## 2023-09-15 MED ORDER — LIDOCAINE 2% (20 MG/ML) 5 ML SYRINGE
INTRAMUSCULAR | Status: DC | PRN
  Administered 2023-09-15: 80 mg via INTRAVENOUS

## 2023-09-15 MED ORDER — ACETAMINOPHEN 500 MG PO TABS
ORAL_TABLET | ORAL | Status: AC
  Filled 2023-09-15: qty 2

## 2023-09-15 MED ORDER — DEXAMETHASONE SODIUM PHOSPHATE 10 MG/ML IJ SOLN
INTRAMUSCULAR | Status: DC | PRN
  Administered 2023-09-15: 10 mg via INTRAVENOUS

## 2023-09-15 MED ORDER — HYDROMORPHONE HCL 1 MG/ML IJ SOLN
INTRAMUSCULAR | Status: AC
  Filled 2023-09-15: qty 0.5

## 2023-09-15 MED ORDER — CEFAZOLIN SODIUM-DEXTROSE 2-4 GM/100ML-% IV SOLN
2.0000 g | INTRAVENOUS | Status: AC
  Administered 2023-09-15: 2 g via INTRAVENOUS

## 2023-09-15 MED ORDER — DEXAMETHASONE SODIUM PHOSPHATE 10 MG/ML IJ SOLN
INTRAMUSCULAR | Status: AC
  Filled 2023-09-15: qty 1

## 2023-09-15 MED ORDER — MIDAZOLAM HCL 2 MG/2ML IJ SOLN
INTRAMUSCULAR | Status: AC
  Filled 2023-09-15: qty 2

## 2023-09-15 MED ORDER — AMISULPRIDE (ANTIEMETIC) 5 MG/2ML IV SOLN
10.0000 mg | Freq: Once | INTRAVENOUS | Status: AC | PRN
Start: 2023-09-15 — End: 2023-09-15
  Administered 2023-09-15: 10 mg via INTRAVENOUS

## 2023-09-15 MED ORDER — AMISULPRIDE (ANTIEMETIC) 5 MG/2ML IV SOLN
INTRAVENOUS | Status: AC
  Filled 2023-09-15: qty 2

## 2023-09-15 MED ORDER — ONDANSETRON HCL 4 MG/2ML IJ SOLN
INTRAMUSCULAR | Status: DC | PRN
  Administered 2023-09-15: 4 mg via INTRAVENOUS

## 2023-09-15 MED ORDER — ACETAMINOPHEN 500 MG PO TABS
1000.0000 mg | ORAL_TABLET | Freq: Once | ORAL | Status: AC
  Administered 2023-09-15: 1000 mg via ORAL

## 2023-09-15 MED ORDER — LACTATED RINGERS IV SOLN: INTRAVENOUS | Status: DC

## 2023-09-15 MED ORDER — ONDANSETRON HCL 4 MG/2ML IJ SOLN
INTRAMUSCULAR | Status: AC
  Filled 2023-09-15: qty 2

## 2023-09-15 MED ORDER — BUPIVACAINE HCL (PF) 0.25 % IJ SOLN
INTRAMUSCULAR | Status: AC
  Filled 2023-09-15: qty 30

## 2023-09-15 SURGICAL SUPPLY — 86 items
BAG DECANTER FOR FLEXI CONT (MISCELLANEOUS) IMPLANT
BANDAGE GAUZE 1X75IN STRL (MISCELLANEOUS) IMPLANT
BENZOIN TINCTURE PRP APPL 2/3 (GAUZE/BANDAGES/DRESSINGS) IMPLANT
BLADE MINI RND TIP GREEN BEAV (BLADE) IMPLANT
BLADE SURG 15 STRL LF DISP TIS (BLADE) ×8 IMPLANT
BNDG COHESIVE 1X5 TAN STRL LF (GAUZE/BANDAGES/DRESSINGS) IMPLANT
BNDG ELASTIC 2INX 5YD STR LF (GAUZE/BANDAGES/DRESSINGS) IMPLANT
BNDG ELASTIC 3INX 5YD STR LF (GAUZE/BANDAGES/DRESSINGS) ×2 IMPLANT
BNDG ESMARK 4X9 LF (GAUZE/BANDAGES/DRESSINGS) ×2 IMPLANT
BNDG GAUZE 1X75IN STRL (MISCELLANEOUS) IMPLANT
BNDG GAUZE DERMACEA FLUFF 4 (GAUZE/BANDAGES/DRESSINGS) IMPLANT
CHLORAPREP W/TINT 26 (MISCELLANEOUS) ×4 IMPLANT
CORD BIPOLAR FORCEPS 12FT (ELECTRODE) ×4 IMPLANT
COTTONBALL LRG STERILE PKG (GAUZE/BANDAGES/DRESSINGS) IMPLANT
COVER BACK TABLE 60X90IN (DRAPES) ×4 IMPLANT
COVER MAYO STAND STRL (DRAPES) ×4 IMPLANT
CUFF TOURN SGL QUICK 18X4 (TOURNIQUET CUFF) ×4 IMPLANT
DEPRESSOR TONGUE 6 IN STERILE (GAUZE/BANDAGES/DRESSINGS) IMPLANT
DRAPE EXTREMITY T 121X128X90 (DISPOSABLE) ×4 IMPLANT
DRAPE OEC MINIVIEW 54X84 (DRAPES) IMPLANT
DRAPE SURG 17X23 STRL (DRAPES) ×4 IMPLANT
GAUZE 4X4 16PLY ~~LOC~~+RFID DBL (SPONGE) IMPLANT
GAUZE PACKING IODOFORM 1/4X15 (PACKING) IMPLANT
GAUZE PAD ABD 8X10 STRL (GAUZE/BANDAGES/DRESSINGS) IMPLANT
GAUZE SPONGE 4X4 12PLY STRL (GAUZE/BANDAGES/DRESSINGS) ×4 IMPLANT
GAUZE STRETCH 2X75IN STRL (MISCELLANEOUS) IMPLANT
GAUZE XEROFORM 1X8 LF (GAUZE/BANDAGES/DRESSINGS) ×4 IMPLANT
GLOVE BIO SURGEON STRL SZ7.5 (GLOVE) ×4 IMPLANT
GLOVE BIOGEL PI IND STRL 8 (GLOVE) ×4 IMPLANT
GOWN STRL REUS W/ TWL LRG LVL3 (GOWN DISPOSABLE) ×4 IMPLANT
GOWN STRL REUS W/TWL XL LVL3 (GOWN DISPOSABLE) ×4 IMPLANT
K-WIRE DBL .035X4 NSTRL (WIRE) ×1 IMPLANT
KWIRE DBL .035X4 NSTRL (WIRE) IMPLANT
LOOP VASCLR MAXI BLUE 18IN ST (MISCELLANEOUS) IMPLANT
NDL BLUNT 17GA (NEEDLE) IMPLANT
NDL HYPO 22X1.5 SAFETY MO (MISCELLANEOUS) IMPLANT
NDL HYPO 25X1 1.5 SAFETY (NEEDLE) IMPLANT
NDL KEITH (NEEDLE) IMPLANT
NEEDLE BLUNT 17GA (NEEDLE) IMPLANT
NEEDLE HYPO 22X1.5 SAFETY MO (MISCELLANEOUS) IMPLANT
NEEDLE HYPO 25X1 1.5 SAFETY (NEEDLE) IMPLANT
NEEDLE KEITH (NEEDLE) IMPLANT
NS IRRIG 1000ML POUR BTL (IV SOLUTION) ×4 IMPLANT
PACK BASIN DAY SURGERY FS (CUSTOM PROCEDURE TRAY) ×4 IMPLANT
PAD CAST 3X4 CTTN HI CHSV (CAST SUPPLIES) ×2 IMPLANT
PAD CAST 4YDX4 CTTN HI CHSV (CAST SUPPLIES) IMPLANT
PADDING CAST ABS COTTON 3X4 (CAST SUPPLIES) IMPLANT
PADDING CAST ABS COTTON 4X4 ST (CAST SUPPLIES) ×4 IMPLANT
SLEEVE SCD COMPRESS KNEE MED (STOCKING) IMPLANT
SPIKE FLUID TRANSFER (MISCELLANEOUS) IMPLANT
SPLINT PLASTER CAST XFAST 3X15 (CAST SUPPLIES) IMPLANT
SPLINT PLASTER CAST XFAST 4X15 (CAST SUPPLIES) IMPLANT
STOCKINETTE 4X48 STRL (DRAPES) ×4 IMPLANT
STRIP CLOSURE SKIN 1/2X4 (GAUZE/BANDAGES/DRESSINGS) IMPLANT
SUT CHROMIC 5 0 P 3 (SUTURE) IMPLANT
SUT ETHIBOND 3-0 V-5 (SUTURE) IMPLANT
SUT ETHILON 3 0 PS 1 (SUTURE) IMPLANT
SUT ETHILON 4 0 PS 2 18 (SUTURE) IMPLANT
SUT ETHILON 5 0 PC 1 (SUTURE) IMPLANT
SUT FIBERWIRE 3-0 18 TAPR NDL (SUTURE) IMPLANT
SUT FIBERWIRE 4-0 18 DIAM BLUE (SUTURE) IMPLANT
SUT MERSILENE 2.0 SH NDLE (SUTURE) IMPLANT
SUT MERSILENE 4 0 P 3 (SUTURE) IMPLANT
SUT MNCRL AB 4-0 PS2 18 (SUTURE) IMPLANT
SUT NYLON ETHILON 5-0 P-3 1X18 (SUTURE) IMPLANT
SUT PROLENE 2 0 SH DA (SUTURE) IMPLANT
SUT PROLENE 6 0 P 1 18 (SUTURE) IMPLANT
SUT SILK 2 0 PERMA HAND 18 BK (SUTURE) IMPLANT
SUT SILK 4 0 PS 2 (SUTURE) IMPLANT
SUT SUPRAMID 4-0 (SUTURE) IMPLANT
SUT VIC AB 3-0 PS1 18XBRD (SUTURE) IMPLANT
SUT VIC AB 4-0 P-3 18XBRD (SUTURE) IMPLANT
SUT VIC AB 4-0 PS2 18 (SUTURE) IMPLANT
SUTURE FIBERWR 3-0 18 TAPR NDL (SUTURE) IMPLANT
SUTURE FIBERWR 4-0 18 DIA BLUE (SUTURE) IMPLANT
SWAB COLLECTION DEVICE MRSA (MISCELLANEOUS) IMPLANT
SWAB CULTURE ESWAB REG 1ML (MISCELLANEOUS) IMPLANT
SYR 20ML LL LF (SYRINGE) IMPLANT
SYR BULB EAR ULCER 3OZ GRN STR (SYRINGE) ×4 IMPLANT
SYR CONTROL 10ML LL (SYRINGE) IMPLANT
SYR TOOMEY 50ML (SYRINGE) IMPLANT
TIE VASCULAR MAXI BLUE 18IN ST (MISCELLANEOUS) IMPLANT
TOWEL GREEN STERILE FF (TOWEL DISPOSABLE) ×8 IMPLANT
TUBE NG 5FR 35IN ENFIT (TUBING) IMPLANT
UNDERPAD 30X36 HEAVY ABSORB (UNDERPADS AND DIAPERS) ×4 IMPLANT
VASCULAR TIE MAXI BLUE 18IN ST (MISCELLANEOUS)

## 2023-09-15 NOTE — Discharge Instructions (Signed)
[facility] Hand and Upper Extrem[facility] Surgery Post-Operative Instructions    General -These instructions are to compliment information given to you by your surgeon. -You may resume your normal diet as tolerated.  -You may resume your normal medications unless specifically instructed to stop taking a certain medication. -If you are not sure about restarting one of your medications after surgery, please contact the office during normal business hours and we will be able to assist you.   Post-Operative Dressings Splint: If you have a splint on your operative arm, it should stay on with all the dressings over it until you return for follow-up. It is ok to take a shower while you are wearing your splint, as long as you do not get it wet. The splint must be covered and kept clean and dry. If your splint gets wet, please call the clinic as you may need to come to clinic early for this to be changed.   If you have questions about your dressings, please call the clinic.   Post-operative Wound Care In general:  Any sutures or anything that will not fall off over time on their own, will be removed in clinic by our staff.  -If your surgical wound/incision was closed with non-absorbable sutures or staples, they will be removed at your first post-op visit.   -If your incision was closed with absorbable sutures, they do not need to be removed as they will dissolve on their own. They may be visible or buried underneath the skin.     Regardless of any sutures, stickers or glue used to close your wound, do not submerge the wound in any standing water for at least 4 weeks after surgery.  It is okay to let the shower water run over the wound and gently clean the wound with soapy water then rinse and gently pat dry.   Weightbearing/Activity  Do not use your operative extremity for any lifting or weight bearing  Please start finger range of motion exercises right away.  If you have a  splint in place, you should move all joints that are not immobilized by a splint.   Pain Control - After your surgery, post-surgical discomfort or pain is normal. This discomfort can last several days to a few weeks. At certain times of the day (usually evenings/nights) your discomfort may be more intense.  - Do not drive while taking narcotic pain medications.   General Anesthesia or Bier Block: If you did not receive a nerve block during your surgery, you will need to start taking your pain medication shortly after your surgery and should continue to do so as prescribed by your surgeon.  Nerve Block:  If you received a nerve block, it may provide pain relief for one hour to up to two days after your surgery. As long as the nerve block is working, you will experience little or no sensation in the area the surgeon operated on.  As the nerve block wears off, you will begin to experience pain or discomfort. It is very important that you begin taking your prescribed pain medication before the nerve block fully wears off.  Treating your pain at the first sign of the block wearing off will ensure your pain is better controlled and more tolerable when full-sensation returns. Do not wait until the pain is intolerable, as the medicine will be less effective. It is better to treat pain in advance than to try and catch up.  If the nerve block  made your entire arm numb, you will be given an arm sling that you should wear until the block wears off, but no longer than 2 days unless otherwise instructed.   Pain Medication:  Typically, post operative pain can be managed by alternating tylenol and an anti inflammatory medication.   We may also prescribe an opioid pain medication such as Oxycodone, Percocet (oxycodone with Tylenol) or Norco (hydrocodone with Tylenol) for post-operative pain. Some of these medications contain Tylenol (acetaminophen) in them.  It takes between 30 and 45 minutes before pain  medication starts to work. It is important to take your medication before your pain level gets too intense.   Nausea is a common side effect of many pain medications. You will want to eat something before taking your pain medicine to help prevent nausea.   If you are taking a prescription opioid pain medication that contains acetaminophen (Tylenol), we recommend that you do not take additional over the counter acetaminophen (Tylenol).   If you are prescribed oxycodone WITHOUT acetaminophen (Tylenol) in it and you do not have a known allergy, we recommend you take over-the-counter acetaminophen (Tylenol) with the oxycodone.  Take over-the-counter stool softener such as Colace or Sennakot while taking narcotic pain medications to help prevent constipation.    Other pain relieving options:  Elevation: Elevating your operative extremity can be very helpful to reduce this pain. Prop your arm up on pillows to keep the operative site above the level of your heart. If you develop tingling from prolonged elevation, take a break from elevating and this should resolve.  Icing: If you do not have a splint/cast, using a cold pack to ice the affected area a few times a day (15 to 20 minutes at a time) can also help to relieve pain, reduce swelling and bruising.   If you can take nonsteroidal anti-inflammatory medications (NSAIDs, for example: Ibuprofen, Advil, Motrin, Aleve, etc.), you may take them to help control your pain. If you are unsure whether you can take anti-inflammatory medications, please check with your primary care provider.  If you are already taking a prescription of anti-inflammatory medications such as Celebrex (celecoxib) or Mobic (meloxicam) you should not take any additional anti-inflammatory drugs such as Ibuprofen, Advil, Motrin, or Aleve.    Follow Up Please call [facility] at [phone] if you do not receive or are unsure of your first follow-up  appointment.  You should see your surgeon or PA 10-16 days after your surgery unless otherwise instructed.   Please call the office for any problems, including the following:  - Excessive redness of the incisions - Drainage for more than 4 days - Fever of more than 101.5 F - Nausea/vomiting that does not stop  - Numbness, tingling, or discoloration of extremity  - Unable to drink fluids  - Uncontrollable pain     Shaune Pollack, MD Hand and Upper Extremity Surgery The [facility] [place] [phone]

## 2023-09-15 NOTE — Anesthesia Preprocedure Evaluation (Addendum)
Anesthesia Evaluation  Patient identified by MRN, date of birth, ID band Patient awake    Reviewed: Allergy & Precautions, NPO status , Patient's Chart, lab work & pertinent test results  Airway Mallampati: II  TM Distance: >3 FB Neck ROM: Full    Dental  (+) Teeth Intact, Dental Advisory Given   Pulmonary former smoker   Pulmonary exam normal breath sounds clear to auscultation       Cardiovascular negative cardio ROS Normal cardiovascular exam Rhythm:Regular Rate:Normal     Neuro/Psych negative neurological ROS     GI/Hepatic Neg liver ROS,GERD  ,,  Endo/Other  negative endocrine ROS    Renal/GU negative Renal ROS     Musculoskeletal Left thumb metacarpal fracture   Abdominal   Peds  Hematology negative hematology ROS (+)   Anesthesia Other Findings Day of surgery medications reviewed with the patient.  Reproductive/Obstetrics                             Anesthesia Physical Anesthesia Plan  ASA: 2  Anesthesia Plan: Regional   Post-op Pain Management: Tylenol PO (pre-op)* and Toradol IV (intra-op)*   Induction: Intravenous  PONV Risk Score and Plan: 1 and Dexamethasone, Ondansetron and Midazolam  Airway Management Planned: LMA  Additional Equipment:   Intra-op Plan:   Post-operative Plan: Extubation in OR  Informed Consent: I have reviewed the patients History and Physical, chart, labs and discussed the procedure including the risks, benefits and alternatives for the proposed anesthesia with the patient or authorized representative who has indicated his/her understanding and acceptance.     Dental advisory given  Plan Discussed with: CRNA  Anesthesia Plan Comments: (Consented for post-op nerve block if necessary.)       Anesthesia Quick Evaluation

## 2023-09-15 NOTE — Op Note (Signed)
I assisted Surgeons and Role:    * Chiaramonti, Edsel Petrin, MD - Primary    * Cindee Salt, MD - Assisting on the Procedure(s): PERCUTANEOUS PINNING FIXATION PROXIMAL PHALANX FRACTURE LEFT THUMB EXTENSOR TENDON REPAIR LEFT THUMB on 09/15/2023.  I provided assistance on this case as follow set up, approach, identification of the fracture, debridement of wound, identification mobilization of the extensor tendon, stabilization of the inner phalangeal joint and fixation of the fracture, repair of the extensor tendon, closure of the wound and application of the dressing and splint.  Electronically signed by: Cindee Salt, MD Date: 09/15/2023 Time: 12:10 PM

## 2023-09-15 NOTE — H&P (Signed)
Orthopaedic Surgery Hand and Upper Extremity History and Physical Examination 09/15/2023  Referring Provider: No referring provider defined for this encounter.  CC: Left thumb extensor tendon laceration and open fracture  HPI: Tony Gonzales is a 53 y.o. male with a Left thumb extensor tendon laceration and open fracture here today for surgery.   Past Medical History: Past Medical History:  Diagnosis Date   C. difficile diarrhea 2010   Colon polyps    Diverticulitis    GERD (gastroesophageal reflux disease)    Seasonal allergies      Medications: Scheduled Meds: Continuous Infusions:   ceFAZolin (ANCEF) IV     lactated ringers     PRN Meds:.  Allergies: Allergies as of 09/12/2023   (No Known Allergies)    Past Surgical History: Past Surgical History:  Procedure Laterality Date   NASAL SEPTOPLASTY W/ TURBINOPLASTY N/A 10/02/2020   Procedure: NASAL SEPTOPLASTY WITH BILATERAL TURBINATE REDUCTION;  Surgeon: Newman Pies, MD;  Location: [facility];  Service: ENT;  Laterality: N/A;     Social History: Social History   Occupational History   Occupation: Games developer  Tobacco Use   Smoking status: Former    Current packs/day: 0.00    Types: Cigarettes    Quit date: 06/24/1995    Years since quitting: 28.2   Smokeless tobacco: Former    Types: Chew    Quit date: 06/24/1995  Vaping Use   Vaping status: Never Used  Substance and Sexual Activity   Alcohol use: No    Alcohol/week: 0.0 standard drinks of alcohol   Drug use: No   Sexual activity: Yes     Family History: Family History  Problem Relation Age of Onset   Bladder Cancer Father    Coronary artery disease Father        In his 24's   Colon cancer Neg Hx    Otherwise, no relevant orthopaedic family history  ROS: Review of Systems: All systems reviewed and are negative except that mentioned in HPI  Work/Sport/Hobbies: See HPI  Physical Examination: Vitals:   09/15/23 0933   BP: (!) 140/83  Pulse: 67  Resp: 15  Temp: (!) 97.4 F (36.3 C)  SpO2: 99%   Constitutional: Awake, alert.  WN/WD Appearance: healthy, no acute distress, well-groomed Affect: Normal HEENT: EOMI, mucous membranes moist CV: RRR Pulm: breathing comfortably   Left Upper Extremity / Hand Laceration healing on dorsal thumb.  Unable to extend thumb IP joint.  Pertinent Labs: n/a  Imaging: I have personally reviewed the following studies: N/a  Additional Studies: n/a  Assessment/Plan: Left thumb extensor tendon laceration and open fracture  Plan for surgery today.  Shaune Pollack, MD Hand and Upper Extremity Surgery [facility] [phone] 09/15/2023 10:22 AM

## 2023-09-15 NOTE — Brief Op Note (Addendum)
09/15/2023  12:19 PM  PATIENT:  Tony Gonzales  53 y.o. male  PRE-OPERATIVE DIAGNOSIS:  left thumb proximal phalanx fracture and EPL tendon laceration  POST-OPERATIVE DIAGNOSIS:  same  PROCEDURE:  Procedure(s): PERCUTANEOUS PINNING FIXATION PROXIMAL PHALANX FRACTURE LEFT THUMB (Left) EXTENSOR TENDON REPAIR LEFT THUMB (Left)  SURGEON:  Surgeons and Role:    * Mckinzey Entwistle, Edsel Petrin, MD - Primary    * Cindee Salt, MD - Assisting  PHYSICIAN ASSISTANT:   ASSISTANTS: Cindee Salt, MD   ANESTHESIA:   general  EBL:  minimal   BLOOD ADMINISTERED:none  DRAINS: none   LOCAL MEDICATIONS USED:  BUPIVICAINE  and LIDOCAINE   SPECIMEN:  No Specimen  DISPOSITION OF SPECIMEN:  N/A  COUNTS:  YES  TOURNIQUET:   Total Tourniquet Time Documented: Upper Arm (Left) - 67 minutes Total: Upper Arm (Left) - 67 minutes   DICTATION: .Note written in EPIC  PLAN OF CARE: Discharge to home after PACU  PATIENT DISPOSITION:  PACU - hemodynamically stable.   Delay start of Pharmacological VTE agent (>24hrs) due to surgical blood loss or risk of bleeding: not applicable

## 2023-09-15 NOTE — Anesthesia Procedure Notes (Signed)
Procedure Name: LMA Insertion Date/Time: 09/15/2023 10:39 AM  Performed by: Yolanda Bonine, CRNAPre-anesthesia Checklist: Patient identified, Emergency Drugs available, Suction available, Patient being monitored and Timeout performed Patient Re-evaluated:Patient Re-evaluated prior to induction Oxygen Delivery Method: Circle system utilized Preoxygenation: Pre-oxygenation with 100% oxygen Induction Type: IV induction Ventilation: Mask ventilation without difficulty LMA: LMA inserted LMA Size: 5.0 Dental Injury: Teeth and Oropharynx as per pre-operative assessment

## 2023-09-15 NOTE — Transfer of Care (Signed)
Immediate Anesthesia Transfer of Care Note  Patient: Tony Gonzales  Procedure(s) Performed: PERCUTANEOUS PINNING FIXATION PROXIMAL PHALANX FRACTURE LEFT THUMB (Left: Thumb) EXTENSOR TENDON REPAIR LEFT THUMB (Left: Thumb)  Patient Location: PACU  Anesthesia Type:General  Level of Consciousness: sedated  Airway & Oxygen Therapy: Patient Spontanous Breathing and Patient connected to face mask oxygen  Post-op Assessment: Report given to RN and Post -op Vital signs reviewed and stable  Post vital signs: Reviewed and stable  Last Vitals:  Vitals Value Taken Time  BP    Temp    Pulse 98 09/15/23 1214  Resp    SpO2 94 % 09/15/23 1214  Vitals shown include unfiled device data.  Last Pain:  Vitals:   09/15/23 0933  TempSrc: Temporal  PainSc: 0-No pain      Patients Stated Pain Goal: 3 (09/15/23 0933)  Complications: No notable events documented.

## 2023-09-15 NOTE — Addendum Note (Signed)
Addendum  created 09/15/23 1353 by Yolanda Bonine, CRNA   Flowsheet accepted

## 2023-09-15 NOTE — Op Note (Signed)
NAME: Tony Gonzales MEDICAL RECORD NO: [id number] DATE OF BIRTH: 1971-02-27 FACILITY: [facility] LOCATION: [facility] PHYSICIAN: Ramon Dredge MD   OPERATIVE REPORT   DATE OF PROCEDURE: 09/15/23    PREOPERATIVE DIAGNOSIS:  Open left thumb proximal phalanx fracture and extensor pollicis longus laceration   POSTOPERATIVE DIAGNOSIS:  same   PROCEDURE:  Left EPL tendon repair and percutaneous pinning of the thumb proximal phalanx   SURGEON: Edsel Petrin. Mical Kicklighter, M.D.   ASSISTANT: Cindee Salt, MD   ANESTHESIA:  General and Local   INTRAVENOUS FLUIDS:  Per anesthesia flow sheet.   ESTIMATED BLOOD LOSS:  Minimal.   COMPLICATIONS:  None.   SPECIMENS:  none   TOURNIQUET TIME:    Total Tourniquet Time Documented: Upper Arm (Left) - 67 minutes Total: Upper Arm (Left) - 67 minutes    DISPOSITION:  Stable to PACU.   INDICATIONS:  53 year old male who sustained a circular saw injury to the dorsal left thumb that involved EPL tendon laceration as evidenced by inability to extend at IP joint as well as open fracture of the proximal phalanx.  OPERATIVE COURSE:  Patient was identified in holding and the site, side, and surgery were confirmed.    The patient was taken back to the OR and positioned supine with the operative extremity outstretched on a hand table.  A well padded tourniquet was placed on the operative arm.    The arm was prepped and draped in the typical sterile fashion.  A time out was performed.  The arm was exsanguinated with Esmarch bandage and the tourniquet inflated to 250 mmHg.    The prior laceration was incised with a 15 blade scalpel.  The underlying soft tissues were bluntly dissected with tenotomies and pickups.  The EPL tendon was found to be lacerated.  The ends of the EPL tendon were debrided and released from surrounding scar tissue.  There was a fracture defect along the dorsal cortex of the proximal phalanx from the saw  blade.  Under fluoroscopy, the fracture was stressed and there was some motion present despite it being an incomplete fracture.  The wound was thoroughly irrigated with normal saline.  Next, a 0.035 k wire was placed retrograde from the distal phalanx through the proximal phalanx to stabilize the fracture.    Next the proximal EPL tendon was pulled out and pinned to the soft tissues with a 22g needle.  A 4-0 supramid suture was then used to repair the EPL tendon in a modified Kessler technique.  The wound was irrigated again and hemostasis achieved with bipolar.  The skin closed with 4-0 nylons.  Local anesthesia was injected.  The wound and pin covered with xeroform and then sterile dressings were placed and then a thumb spica splint applied.    Patient was awakened and taken to PACU without issue.  Counts were correct x2.   POST OP PLAN:  Patient will remain NWB on LUE.  Will remain in a splint until follow up in clinic in approximately 10-14 days where the wound will be evaluated for suture removal.  The patient will be seen by OT at that time for thermoplastic splint fabrication.   Ramon Dredge, MD Electronically signed, 09/15/23

## 2023-09-15 NOTE — Anesthesia Postprocedure Evaluation (Signed)
Anesthesia Post Note  Patient: [name]  Procedure(s) Performed: PERCUTANEOUS PINNING FIXATION PROXIMAL PHALANX FRACTURE LEFT THUMB (Left: Thumb) EXTENSOR TENDON REPAIR LEFT THUMB (Left: Thumb)     Patient location during evaluation: PACU Anesthesia Type: General Level of consciousness: awake and alert Pain management: pain level controlled Vital Signs Assessment: post-procedure vital signs reviewed and stable Respiratory status: spontaneous breathing, nonlabored ventilation and respiratory function stable Cardiovascular status: blood pressure returned to baseline and stable Postop Assessment: no apparent nausea or vomiting Anesthetic complications: no   No notable events documented.  Last Vitals:  Vitals:   09/15/23 1245 09/15/23 1309  BP: 133/81 126/87  Pulse: 88 77  Resp: 12 18  Temp:  (!) 36.1 C  SpO2: 93% 95%    Last Pain:  Vitals:   09/15/23 1309  TempSrc:   PainSc: 3                  Collene Schlichter

## 2023-09-16 ENCOUNTER — Encounter (HOSPITAL_BASED_OUTPATIENT_CLINIC_OR_DEPARTMENT_OTHER): Payer: Self-pay | Admitting: Orthopedic Surgery
